# Patient Record
Sex: Male | Born: 1980
Health system: Southern US, Community
[De-identification: ages and names within clinical notes are randomized; demographics above are authoritative.]

## PROBLEM LIST (undated history)

## (undated) DIAGNOSIS — E785 Hyperlipidemia, unspecified: Secondary | ICD-10-CM

## (undated) DIAGNOSIS — F419 Anxiety disorder, unspecified: Secondary | ICD-10-CM

## (undated) DIAGNOSIS — Z8639 Personal history of other endocrine, nutritional and metabolic disease: Secondary | ICD-10-CM

## (undated) DIAGNOSIS — G4733 Obstructive sleep apnea (adult) (pediatric): Secondary | ICD-10-CM

## (undated) DIAGNOSIS — Z87891 Personal history of nicotine dependence: Secondary | ICD-10-CM

## (undated) DIAGNOSIS — Q2381 Bicuspid aortic valve: Secondary | ICD-10-CM

## (undated) DIAGNOSIS — E663 Overweight: Secondary | ICD-10-CM

## (undated) DIAGNOSIS — I77810 Thoracic aortic ectasia: Secondary | ICD-10-CM

## (undated) DIAGNOSIS — R7989 Other specified abnormal findings of blood chemistry: Secondary | ICD-10-CM

## (undated) HISTORY — DX: Thoracic aortic ectasia: I77.810

## (undated) HISTORY — DX: Personal history of nicotine dependence: Z87.891

## (undated) HISTORY — DX: Personal history of other endocrine, nutritional and metabolic disease: Z86.39

## (undated) HISTORY — DX: Other specified abnormal findings of blood chemistry: R79.89

## (undated) HISTORY — DX: Overweight: E66.3

## (undated) HISTORY — DX: Obstructive sleep apnea (adult) (pediatric): G47.33

## (undated) HISTORY — DX: Bicuspid aortic valve: Q23.81

## (undated) HISTORY — DX: Anxiety disorder, unspecified: F41.9

---

## 2014-03-02 ENCOUNTER — Other Ambulatory Visit (HOSPITAL_COMMUNITY): Payer: Self-pay | Admitting: Medical

## 2014-03-02 ENCOUNTER — Ambulatory Visit (HOSPITAL_COMMUNITY)
Admission: RE | Admit: 2014-03-02 | Discharge: 2014-03-02 | Disposition: A | Discharge status: Home / Self Care | Payer: 59 | Source: Ambulatory Visit | Attending: Medical | Admitting: Medical

## 2014-03-02 DIAGNOSIS — R1031 Right lower quadrant pain: Secondary | ICD-10-CM | POA: Insufficient documentation

## 2014-03-02 DIAGNOSIS — R911 Solitary pulmonary nodule: Secondary | ICD-10-CM | POA: Insufficient documentation

## 2014-03-02 DIAGNOSIS — D739 Disease of spleen, unspecified: Secondary | ICD-10-CM | POA: Insufficient documentation

## 2014-03-02 MED ORDER — IOHEXOL 300 MG/ML  SOLN
100.0000 mL | Freq: Once | INTRAMUSCULAR | Status: AC | PRN
  Administered 2014-03-02: 100 mL via INTRAVENOUS

## 2014-12-25 ENCOUNTER — Other Ambulatory Visit: Payer: Self-pay | Admitting: Medical

## 2014-12-25 ENCOUNTER — Other Ambulatory Visit: Payer: Self-pay | Admitting: Physician Assistant

## 2014-12-25 DIAGNOSIS — M25561 Pain in right knee: Secondary | ICD-10-CM

## 2014-12-31 ENCOUNTER — Ambulatory Visit
Admission: RE | Admit: 2014-12-31 | Discharge: 2014-12-31 | Disposition: A | Discharge status: Home / Self Care | Payer: 59 | Source: Ambulatory Visit | Attending: Medical | Admitting: Medical

## 2014-12-31 DIAGNOSIS — M25561 Pain in right knee: Secondary | ICD-10-CM

## 2015-05-28 ENCOUNTER — Other Ambulatory Visit: Payer: Self-pay | Admitting: Orthopedic Surgery

## 2015-06-05 ENCOUNTER — Encounter (HOSPITAL_BASED_OUTPATIENT_CLINIC_OR_DEPARTMENT_OTHER): Payer: Self-pay | Admitting: *Deleted

## 2015-06-07 NOTE — H&P (Signed)
Donald Swanson is an 34 y.o. male.    Chief Complaint: Right Knee Pain  HPI: Patient presents with a chief complaint of right knee pain.  Patient states that he injured his knee approximately 10 years ago when he was working close to the ground and kneeling.  When he stood up he notices a loud pop and soreness in his knee.  He did initially go to [facility] in 2006 and elected to treat his symptoms conservatively.  He was recently seen elsewhere where he had an MRI and is here today to discuss the results.  He states his pain comes and goes and is moderate in severity.  He describes a stabbing-type pain.  He is noticed swelling and weakness.  It does wake him from sleep.  Worse with activity and work and sitting crosslegged or working on the floor.  Better with rest.  He denies any fevers chills night sweats or other signs of infection.  Past Medical History  Diagnosis Date  . Hyperlipemia     Past Surgical History  Procedure Laterality Date  . No past surgeries      History reviewed. No pertinent family history. Social History:  reports that he has quit smoking. He has never used smokeless tobacco. He reports that he drinks alcohol. He reports that he does not use illicit drugs.  Allergies: No Known Allergies  No prescriptions prior to admission    No results found for this or any previous visit (from the past 48 hour(s)). No results found.  Review of Systems  Constitutional: Negative.   HENT:       Sinus problems  Eyes: Negative.   Respiratory: Negative.   Cardiovascular: Negative.   Gastrointestinal: Negative.   Genitourinary: Negative.   Musculoskeletal: Positive for joint pain.  Skin: Negative.   Neurological: Negative.   Endo/Heme/Allergies: Negative.   Psychiatric/Behavioral: Negative.     Height  (1.626 m), weight 77.111 kg (170 lb). Physical Exam  Constitutional: He is oriented to person, place, and time. He appears well-developed and  well-nourished.  HENT:  Head: Normocephalic and atraumatic.  Eyes: Pupils are equal, round, and reactive to light.  Neck: Normal range of motion. Neck supple.  Cardiovascular: Intact distal pulses.   Respiratory: Effort normal.  Musculoskeletal:  the patient's left knee has good strength good range of motion and no pain.  Patient's right knee also has good strength and good range of motion.  He does have tenderness over the medial joint line.  McMurray's test does cause a small increase in symptoms.  No instability.  His calves are soft and nontender.  He is neurovascularly intact distally.  Neurological: He is alert and oriented to person, place, and time.  Skin: Skin is warm and dry.  Psychiatric: He has a normal mood and affect. His behavior is normal. Judgment and thought content normal.     Assessment/Plan Assess: Right knee medial meniscal tear  Plan: Treatment options are discussed with the patient.  He is elected to proceed with surgery.  Benefits risks and potential competitions of surgery are discussed.  He wishes to proceed and a posting slip is completed.  Patient is to discuss timing with our surgery scheduler.  We anticipate seeing the patient back at time of surgical intervention.  No medications asked for or given today.  Call with any issues.  Donald Swanson 06/07/2015, 8:49 AM

## 2015-06-10 ENCOUNTER — Ambulatory Visit (HOSPITAL_BASED_OUTPATIENT_CLINIC_OR_DEPARTMENT_OTHER): Payer: 59 | Admitting: Anesthesiology

## 2015-06-10 ENCOUNTER — Encounter (HOSPITAL_BASED_OUTPATIENT_CLINIC_OR_DEPARTMENT_OTHER)
Admission: RE | Disposition: A | Discharge status: Home / Self Care | Payer: Self-pay | Source: Ambulatory Visit | Attending: Orthopedic Surgery

## 2015-06-10 ENCOUNTER — Encounter (HOSPITAL_BASED_OUTPATIENT_CLINIC_OR_DEPARTMENT_OTHER): Payer: Self-pay | Admitting: Anesthesiology

## 2015-06-10 ENCOUNTER — Ambulatory Visit (HOSPITAL_BASED_OUTPATIENT_CLINIC_OR_DEPARTMENT_OTHER)
Admission: RE | Admit: 2015-06-10 | Discharge: 2015-06-10 | Disposition: A | Discharge status: Home / Self Care | Payer: 59 | Source: Ambulatory Visit | Attending: Orthopedic Surgery | Admitting: Orthopedic Surgery

## 2015-06-10 DIAGNOSIS — M23321 Other meniscus derangements, posterior horn of medial meniscus, right knee: Secondary | ICD-10-CM | POA: Insufficient documentation

## 2015-06-10 DIAGNOSIS — Z87891 Personal history of nicotine dependence: Secondary | ICD-10-CM | POA: Diagnosis not present

## 2015-06-10 DIAGNOSIS — S83241A Other tear of medial meniscus, current injury, right knee, initial encounter: Secondary | ICD-10-CM

## 2015-06-10 DIAGNOSIS — E785 Hyperlipidemia, unspecified: Secondary | ICD-10-CM | POA: Insufficient documentation

## 2015-06-10 HISTORY — DX: Hyperlipidemia, unspecified: E78.5

## 2015-06-10 HISTORY — PX: KNEE ARTHROSCOPY WITH MEDIAL MENISECTOMY: SHX5651

## 2015-06-10 LAB — POCT HEMOGLOBIN-HEMACUE: Hemoglobin: 16.1 g/dL (ref 13.0–17.0)

## 2015-06-10 SURGERY — ARTHROSCOPY, KNEE, WITH MEDIAL MENISCECTOMY
Anesthesia: General | Laterality: Right

## 2015-06-10 MED ORDER — MIDAZOLAM HCL 2 MG/2ML IJ SOLN
INTRAMUSCULAR | Status: AC
  Filled 2015-06-10: qty 2

## 2015-06-10 MED ORDER — BUPIVACAINE-EPINEPHRINE 0.5% -1:200000 IJ SOLN
INTRAMUSCULAR | Status: DC | PRN
Start: 2015-06-10 — End: 2015-06-10
  Administered 2015-06-10: 30 mL

## 2015-06-10 MED ORDER — ONDANSETRON HCL 4 MG PO TABS: 4.0000 mg | ORAL_TABLET | Freq: Four times a day (QID) | ORAL | Status: DC | PRN

## 2015-06-10 MED ORDER — HYDROCODONE-ACETAMINOPHEN 5-325 MG PO TABS: 1.0000 | ORAL_TABLET | Freq: Four times a day (QID) | ORAL | Status: DC | PRN

## 2015-06-10 MED ORDER — OXYCODONE HCL 5 MG PO TABS
ORAL_TABLET | ORAL | Status: AC
  Filled 2015-06-10: qty 1

## 2015-06-10 MED ORDER — OXYCODONE HCL 5 MG PO TABS
5.0000 mg | ORAL_TABLET | Freq: Once | ORAL | Status: AC | PRN
  Administered 2015-06-10: 5 mg via ORAL

## 2015-06-10 MED ORDER — DEXTROSE 5 % IV SOLN
3.0000 g | INTRAVENOUS | Status: AC
  Administered 2015-06-10: 2 g via INTRAVENOUS

## 2015-06-10 MED ORDER — BUPIVACAINE HCL (PF) 0.5 % IJ SOLN
INTRAMUSCULAR | Status: AC
  Filled 2015-06-10: qty 30

## 2015-06-10 MED ORDER — ONDANSETRON HCL 4 MG/2ML IJ SOLN
INTRAMUSCULAR | Status: DC | PRN
  Administered 2015-06-10: 4 mg via INTRAVENOUS

## 2015-06-10 MED ORDER — PROPOFOL 10 MG/ML IV BOLUS
INTRAVENOUS | Status: DC | PRN
  Administered 2015-06-10: 200 mg via INTRAVENOUS

## 2015-06-10 MED ORDER — BUPIVACAINE-EPINEPHRINE (PF) 0.5% -1:200000 IJ SOLN
INTRAMUSCULAR | Status: AC
  Filled 2015-06-10: qty 30

## 2015-06-10 MED ORDER — FENTANYL CITRATE (PF) 100 MCG/2ML IJ SOLN
INTRAMUSCULAR | Status: AC
  Filled 2015-06-10: qty 4

## 2015-06-10 MED ORDER — DEXAMETHASONE SODIUM PHOSPHATE 4 MG/ML IJ SOLN
INTRAMUSCULAR | Status: DC | PRN
  Administered 2015-06-10: 10 mg via INTRAVENOUS

## 2015-06-10 MED ORDER — SCOPOLAMINE 1 MG/3DAYS TD PT72: 1.0000 | MEDICATED_PATCH | Freq: Once | TRANSDERMAL | Status: DC | PRN

## 2015-06-10 MED ORDER — LACTATED RINGERS IV SOLN
INTRAVENOUS | Status: DC
  Administered 2015-06-10: 13:00:00 via INTRAVENOUS

## 2015-06-10 MED ORDER — GLYCOPYRROLATE 0.2 MG/ML IJ SOLN: 0.2000 mg | Freq: Once | INTRAMUSCULAR | Status: DC | PRN

## 2015-06-10 MED ORDER — OXYCODONE HCL 5 MG/5ML PO SOLN: 5.0000 mg | Freq: Once | ORAL | Status: AC | PRN

## 2015-06-10 MED ORDER — CEFAZOLIN SODIUM-DEXTROSE 2-3 GM-% IV SOLR
INTRAVENOUS | Status: AC
  Filled 2015-06-10: qty 50

## 2015-06-10 MED ORDER — FENTANYL CITRATE (PF) 100 MCG/2ML IJ SOLN
50.0000 ug | INTRAMUSCULAR | Status: DC | PRN
  Administered 2015-06-10: 50 ug via INTRAVENOUS
  Administered 2015-06-10: 100 ug via INTRAVENOUS

## 2015-06-10 MED ORDER — LIDOCAINE HCL (CARDIAC) 20 MG/ML IV SOLN
INTRAVENOUS | Status: DC | PRN
  Administered 2015-06-10: 50 mg via INTRAVENOUS

## 2015-06-10 MED ORDER — METOCLOPRAMIDE HCL 5 MG PO TABS: 5.0000 mg | ORAL_TABLET | Freq: Three times a day (TID) | ORAL | Status: DC | PRN

## 2015-06-10 MED ORDER — ONDANSETRON HCL 4 MG/2ML IJ SOLN: 4.0000 mg | Freq: Four times a day (QID) | INTRAMUSCULAR | Status: DC | PRN

## 2015-06-10 MED ORDER — MEPERIDINE HCL 25 MG/ML IJ SOLN: 6.2500 mg | INTRAMUSCULAR | Status: DC | PRN

## 2015-06-10 MED ORDER — CHLORHEXIDINE GLUCONATE 4 % EX LIQD: 60.0000 mL | Freq: Once | CUTANEOUS | Status: DC

## 2015-06-10 MED ORDER — EPINEPHRINE HCL 1 MG/ML IJ SOLN
INTRAMUSCULAR | Status: DC | PRN
  Administered 2015-06-10: 1 mg

## 2015-06-10 MED ORDER — SODIUM CHLORIDE 0.9 % IR SOLN
Status: DC | PRN
  Administered 2015-06-10: 3000 mL

## 2015-06-10 MED ORDER — HYDROMORPHONE HCL 1 MG/ML IJ SOLN: 0.2500 mg | INTRAMUSCULAR | Status: DC | PRN

## 2015-06-10 MED ORDER — KETOROLAC TROMETHAMINE 30 MG/ML IJ SOLN
INTRAMUSCULAR | Status: DC | PRN
  Administered 2015-06-10: 30 mg via INTRAVENOUS

## 2015-06-10 MED ORDER — MIDAZOLAM HCL 2 MG/2ML IJ SOLN
1.0000 mg | INTRAMUSCULAR | Status: DC | PRN
  Administered 2015-06-10: 2 mg via INTRAVENOUS

## 2015-06-10 MED ORDER — METOCLOPRAMIDE HCL 5 MG/ML IJ SOLN: 5.0000 mg | Freq: Three times a day (TID) | INTRAMUSCULAR | Status: DC | PRN

## 2015-06-10 SURGICAL SUPPLY — 42 items
BANDAGE ELASTIC 6 VELCRO ST LF (GAUZE/BANDAGES/DRESSINGS) ×3 IMPLANT
BLADE 4.2CUDA (BLADE) IMPLANT
BLADE CUTTER GATOR 3.5 (BLADE) ×3 IMPLANT
BLADE GREAT WHITE 4.2 (BLADE) IMPLANT
BLADE GREAT WHITE 4.2MM (BLADE)
BNDG COHESIVE 6X5 TAN STRL LF (GAUZE/BANDAGES/DRESSINGS) ×3 IMPLANT
DRAPE ARTHROSCOPY W/POUCH 114 (DRAPES) ×3 IMPLANT
DURAPREP 26ML APPLICATOR (WOUND CARE) ×3 IMPLANT
ELECT MENISCUS 165MM 90D (ELECTRODE) IMPLANT
ELECT REM PT RETURN 9FT ADLT (ELECTROSURGICAL)
ELECTRODE REM PT RTRN 9FT ADLT (ELECTROSURGICAL) IMPLANT
GAUZE SPONGE 4X4 12PLY STRL (GAUZE/BANDAGES/DRESSINGS) ×3 IMPLANT
GAUZE XEROFORM 1X8 LF (GAUZE/BANDAGES/DRESSINGS) ×3 IMPLANT
GLOVE BIO SURGEON STRL SZ7.5 (GLOVE) ×3 IMPLANT
GLOVE BIO SURGEON STRL SZ8.5 (GLOVE) ×3 IMPLANT
GLOVE BIOGEL PI IND STRL 7.0 (GLOVE) ×2 IMPLANT
GLOVE BIOGEL PI IND STRL 8 (GLOVE) ×1 IMPLANT
GLOVE BIOGEL PI IND STRL 9 (GLOVE) ×1 IMPLANT
GLOVE BIOGEL PI INDICATOR 7.0 (GLOVE) ×4
GLOVE BIOGEL PI INDICATOR 8 (GLOVE) ×2
GLOVE BIOGEL PI INDICATOR 9 (GLOVE) ×2
GLOVE ECLIPSE 6.5 STRL STRAW (GLOVE) ×6 IMPLANT
GOWN STRL REUS W/ TWL LRG LVL3 (GOWN DISPOSABLE) ×2 IMPLANT
GOWN STRL REUS W/TWL LRG LVL3 (GOWN DISPOSABLE) ×4
GOWN STRL REUS W/TWL XL LVL3 (GOWN DISPOSABLE) ×3 IMPLANT
IV NS IRRIG 3000ML ARTHROMATIC (IV SOLUTION) ×3 IMPLANT
KNEE WRAP E Z 3 GEL PACK (MISCELLANEOUS) ×3 IMPLANT
MANIFOLD NEPTUNE II (INSTRUMENTS) IMPLANT
NDL SAFETY ECLIPSE 18X1.5 (NEEDLE) ×1 IMPLANT
NEEDLE HYPO 18GX1.5 SHARP (NEEDLE) ×2
PACK ARTHROSCOPY DSU (CUSTOM PROCEDURE TRAY) ×3 IMPLANT
PACK BASIN DAY SURGERY FS (CUSTOM PROCEDURE TRAY) ×3 IMPLANT
PAD ALCOHOL SWAB (MISCELLANEOUS) ×3 IMPLANT
PENCIL BUTTON HOLSTER BLD 10FT (ELECTRODE) IMPLANT
SET ARTHROSCOPY TUBING (MISCELLANEOUS) ×2
SET ARTHROSCOPY TUBING LN (MISCELLANEOUS) ×1 IMPLANT
SLEEVE SCD COMPRESS KNEE MED (MISCELLANEOUS) IMPLANT
SYR 3ML 18GX1 1/2 (SYRINGE) IMPLANT
SYR 5ML LL (SYRINGE) ×3 IMPLANT
TOWEL OR 17X24 6PK STRL BLUE (TOWEL DISPOSABLE) ×3 IMPLANT
WAND STAR VAC 90 (SURGICAL WAND) IMPLANT
WATER STERILE IRR 1000ML POUR (IV SOLUTION) ×3 IMPLANT

## 2015-06-10 NOTE — Interval H&P Note (Signed)
History and Physical Interval Note:  06/10/2015 2:08 PM  Donald Swanson  has presented today for surgery, with the diagnosis of RIGHTKNEE MEDIAL MENISCUS TEAR  The various methods of treatment have been discussed with the patient and family. After consideration of risks, benefits and other options for treatment, the patient has consented to  Procedure(s): RIGHT ARTHROSCOPY KNEE (Right) as a surgical intervention .  The patient's history has been reviewed, patient examined, no change in status, stable for surgery.  I have reviewed the patient's chart and labs.  Questions were answered to the patient's satisfaction.     [name]

## 2015-06-10 NOTE — Op Note (Signed)
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 Turner Daniels M.D.  Assist: Tomi Likens. Gaylene Brooks  (present throughout entire procedure and necessary for timely completion of the procedure) Anes: General LMA  EBL: Minimal  Fluids: 800 cc   Indications: catching popping and intermittent pain of the right knee that is gone for the last few years.  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 Pt has failed conservative treatment with anti-inflammatory medicines, physical therapy, and modified activites but did get good temporarily from an intra-articular cortisone injection. Pain has recurred and patient desires elective arthroscopic evaluation and treatment of knee. Risks and benefits of surgery have been discussed and questions answered.  Procedure: Patient identified by arm band and taken to the operating room at the day surgery [facility]. The appropriate anesthetic monitors were attached, and General LMA anesthesia was induced without difficulty. Lateral post was applied to the table and the lower extremity was prepped and draped in usual sterile fashion from the ankle to the midthigh. Time out procedure was performed. We began the operation by making standard inferior lateral and inferior medial peripatellar portals with a #11 blade allowing introduction of the arthroscope through the inferior lateral portal and the out flow to the inferior medial portal. Pump pressure was set at 100 mmHg and diagnostic arthroscopy  revealed a normal patellofemoral joint.  Moving into the medial compartment the medial meniscus had a large parrot-beak tear posterior horn that it's flipped into the notch region and was removed with a 3.5  Gator sucker shaver as well as a small arthroscopic biter.  There was some grade 2-3 chondromalacia of the medial femoral condyle near the notch over a 6 x 6 mm area and this was also debrided back to a stable margin with a 3.5 mm Gator sucker shaver.. The knee was irrigated out normal saline solution. A dressing of xerofoam 4 x 4 dressing sponges, web roll and an Ace wrap was applied. The patient was awakened extubated and taken to the recovery without difficulty.    Signed: [name], MD

## 2015-06-10 NOTE — Transfer of Care (Signed)
Immediate Anesthesia Transfer of Care Note  Patient: Donald Swanson  Procedure(s) Performed: Procedure(s): knee arthroscopy with medial menisectomy   (Right)  Patient Location: PACU  Anesthesia Type:General  Level of Consciousness: awake, alert  and oriented  Airway & Oxygen Therapy: Patient Spontanous Breathing and Patient connected to face mask oxygen  Post-op Assessment: Report given to RN and Post -op Vital signs reviewed and stable  Post vital signs: Reviewed and stable  Last Vitals:  Filed Vitals:   06/10/15 1330  BP: 134/62  Pulse: 58  Temp: 36.7 C  Resp: 16    Complications: No apparent anesthesia complications

## 2015-06-10 NOTE — Anesthesia Preprocedure Evaluation (Signed)
Anesthesia Evaluation  Patient identified by MRN, date of birth, ID band Patient awake    Reviewed: Allergy & Precautions, NPO status , Patient's Chart, lab work & pertinent test results  Airway Mallampati: I  TM Distance: >3 FB Neck ROM: Full    Dental  (+) Teeth Intact, Dental Advisory Given   Pulmonary former smoker,  breath sounds clear to auscultation        Cardiovascular Rhythm:Regular Rate:Normal     Neuro/Psych    GI/Hepatic   Endo/Other    Renal/GU      Musculoskeletal   Abdominal   Peds  Hematology   Anesthesia Other Findings   Reproductive/Obstetrics                             Anesthesia Physical Anesthesia Plan  ASA: I  Anesthesia Plan: General   Post-op Pain Management:    Induction: Intravenous  Airway Management Planned: LMA  Additional Equipment:   Intra-op Plan:   Post-operative Plan: Extubation in OR  Informed Consent: I have reviewed the patients History and Physical, chart, labs and discussed the procedure including the risks, benefits and alternatives for the proposed anesthesia with the patient or authorized representative who has indicated his/her understanding and acceptance.   Dental advisory given  Plan Discussed with: CRNA, Anesthesiologist and Surgeon  Anesthesia Plan Comments:        Anesthesia Quick Evaluation  

## 2015-06-10 NOTE — Discharge Instructions (Addendum)
Arthroscopic Procedure, Knee An arthroscopic procedure can find what is wrong with your knee. PROCEDURE Arthroscopy is a surgical technique that allows your orthopedic surgeon to diagnose and treat your knee injury with accuracy. They will look into your knee through a small instrument. This is almost like a small (pencil sized) telescope. Because arthroscopy affects your knee less than open knee surgery, you can anticipate a more rapid recovery. Taking an active role by following your caregiver's instructions will help with rapid and complete recovery. Use crutches, rest, elevation, ice, and knee exercises as instructed. The length of recovery depends on various factors including type of injury, age, physical condition, medical conditions, and your rehabilitation. Your knee is the joint between the large bones (femur and tibia) in your leg. Cartilage covers these bone ends which are smooth and slippery and allow your knee to bend and move smoothly. Two menisci, thick, semi-lunar shaped pads of cartilage which form a rim inside the joint, help absorb shock and stabilize your knee. Ligaments bind the bones together and support your knee joint. Muscles move the joint, help support your knee, and take stress off the joint itself. Because of this all programs and physical therapy to rehabilitate an injured or repaired knee require rebuilding and strengthening your muscles. AFTER THE PROCEDURE  After the procedure, you will be moved to a recovery area until most of the effects of the medication have worn off. Your caregiver will discuss the test results with you.  Only take over-the-counter or prescription medicines for pain, discomfort, or fever as directed by your caregiver. SEEK MEDICAL CARE IF:   You have increased bleeding from your wounds.  You see redness, swelling, or have increasing pain in your wounds.  You have pus coming from your wound.  You have an oral temperature above 102 F (38.9  C).  You notice a bad smell coming from the wound or dressing.  You have severe pain with any motion of your knee. SEEK IMMEDIATE MEDICAL CARE IF:   You develop a rash.  You have difficulty breathing.  You have any allergic problems. Document Released: 09/25/2000 Document Revised: 12/21/2011 Document Reviewed: 04/18/2008 [facility] [place] Patient Information 2015 [place], [place]. This information is not intended to replace advice given to you by your health care provider. Make sure you discuss any questions you have with your health care provider.  Post Anesthesia Home Care Instructions  Activity: Get plenty of rest for the remainder of the day. A responsible adult should stay with you for 24 hours following the procedure.  For the next 24 hours, DO NOT: -Drive a car -Paediatric nurse -Drink alcoholic beverages -Take any medication unless instructed by your physician -Make any legal decisions or sign important papers.  Meals: Start with liquid foods such as gelatin or soup. Progress to regular foods as tolerated. Avoid greasy, spicy, heavy foods. If nausea and/or vomiting occur, drink only clear liquids until the nausea and/or vomiting subsides. Call your physician if vomiting continues.  Special Instructions/Symptoms: Your throat may feel dry or sore from the anesthesia or the breathing tube placed in your throat during surgery. If this causes discomfort, gargle with warm salt water. The discomfort should disappear within 24 hours.  If you had a scopolamine patch placed behind your ear for the management of post- operative nausea and/or vomiting:  1. The medication in the patch is effective for 72 hours, after which it should be removed.  Wrap patch in a tissue and discard in the trash. Wash hands  thoroughly with soap and water. 2. You may remove the patch earlier than 72 hours if you experience unpleasant side effects which may include dry mouth, dizziness or visual disturbances. 3.  Avoid touching the patch. Wash your hands with soap and water after contact with the patch.    Post Anesthesia Home Care Instructions  Activity: Get plenty of rest for the remainder of the day. A responsible adult should stay with you for 24 hours following the procedure.  For the next 24 hours, DO NOT: -Drive a car -Advertising copywriter -Drink alcoholic beverages -Take any medication unless instructed by your physician -Make any legal decisions or sign important papers.  Meals: Start with liquid foods such as gelatin or soup. Progress to regular foods as tolerated. Avoid greasy, spicy, heavy foods. If nausea and/or vomiting occur, drink only clear liquids until the nausea and/or vomiting subsides. Call your physician if vomiting continues.  Special Instructions/Symptoms: Your throat may feel dry or sore from the anesthesia or the breathing tube placed in your throat during surgery. If this causes discomfort, gargle with warm salt water. The discomfort should disappear within 24 hours.  If you had a scopolamine patch placed behind your ear for the management of post- operative nausea and/or vomiting:  1. The medication in the patch is effective for 72 hours, after which it should be removed.  Wrap patch in a tissue and discard in the trash. Wash hands thoroughly with soap and water. 2. You may remove the patch earlier than 72 hours if you experience unpleasant side effects which may include dry mouth, dizziness or visual disturbances. 3. Avoid touching the patch. Wash your hands with soap and water after contact with the patch.    Post Anesthesia Home Care Instructions  Activity: Get plenty of rest for the remainder of the day. A responsible adult should stay with you for 24 hours following the procedure.  For the next 24 hours, DO NOT: -Drive a car -Advertising copywriter -Drink alcoholic beverages -Take any medication unless instructed by your physician -Make any legal decisions or sign  important papers.  Meals: Start with liquid foods such as gelatin or soup. Progress to regular foods as tolerated. Avoid greasy, spicy, heavy foods. If nausea and/or vomiting occur, drink only clear liquids until the nausea and/or vomiting subsides. Call your physician if vomiting continues.  Special Instructions/Symptoms: Your throat may feel dry or sore from the anesthesia or the breathing tube placed in your throat during surgery. If this causes discomfort, gargle with warm salt water. The discomfort should disappear within 24 hours.  If you had a scopolamine patch placed behind your ear for the management of post- operative nausea and/or vomiting:  1. The medication in the patch is effective for 72 hours, after which it should be removed.  Wrap patch in a tissue and discard in the trash. Wash hands thoroughly with soap and water. 2. You may remove the patch earlier than 72 hours if you experience unpleasant side effects which may include dry mouth, dizziness or visual disturbances. 3. Avoid touching the patch. Wash your hands with soap and water after contact with the patch.    Post Anesthesia Home Care Instructions  Activity: Get plenty of rest for the remainder of the day. A responsible adult should stay with you for 24 hours following the procedure.  For the next 24 hours, DO NOT: -Drive a car -Advertising copywriter -Drink alcoholic beverages -Take any medication unless instructed by your physician -Make any legal decisions  or sign important papers.  Meals: Start with liquid foods such as gelatin or soup. Progress to regular foods as tolerated. Avoid greasy, spicy, heavy foods. If nausea and/or vomiting occur, drink only clear liquids until the nausea and/or vomiting subsides. Call your physician if vomiting continues.  Special Instructions/Symptoms: Your throat may feel dry or sore from the anesthesia or the breathing tube placed in your throat during surgery. If this causes  discomfort, gargle with warm salt water. The discomfort should disappear within 24 hours.  If you had a scopolamine patch placed behind your ear for the management of post- operative nausea and/or vomiting:  1. The medication in the patch is effective for 72 hours, after which it should be removed.  Wrap patch in a tissue and discard in the trash. Wash hands thoroughly with soap and water. 2. You may remove the patch earlier than 72 hours if you experience unpleasant side effects which may include dry mouth, dizziness or visual disturbances. 3. Avoid touching the patch. Wash your hands with soap and water after contact with the patch.      Post Anesthesia Home Care Instructions  Activity: Get plenty of rest for the remainder of the day. A responsible adult should stay with you for 24 hours following the procedure.  For the next 24 hours, DO NOT: -Drive a car -Advertising copywriter -Drink alcoholic beverages -Take any medication unless instructed by your physician -Make any legal decisions or sign important papers.  Meals: Start with liquid foods such as gelatin or soup. Progress to regular foods as tolerated. Avoid greasy, spicy, heavy foods. If nausea and/or vomiting occur, drink only clear liquids until the nausea and/or vomiting subsides. Call your physician if vomiting continues.  Special Instructions/Symptoms: Your throat may feel dry or sore from the anesthesia or the breathing tube placed in your throat during surgery. If this causes discomfort, gargle with warm salt water. The discomfort should disappear within 24 hours.  If you had a scopolamine patch placed behind your ear for the management of post- operative nausea and/or vomiting:  1. The medication in the patch is effective for 72 hours, after which it should be removed.  Wrap patch in a tissue and discard in the trash. Wash hands thoroughly with soap and water. 2. You may remove the patch earlier than 72 hours if you  experience unpleasant side effects which may include dry mouth, dizziness or visual disturbances. 3. Avoid touching the patch. Wash your hands with soap and water after contact with the patch.

## 2015-06-11 NOTE — Anesthesia Postprocedure Evaluation (Signed)
  Anesthesia Post-op Note  Patient: Donald Swanson  Procedure(s) Performed: Procedure(s) (LRB): knee arthroscopy with medial menisectomy   (Right)  Patient Location: PACU  Anesthesia Type: General  Level of Consciousness: awake and alert   Airway and Oxygen Therapy: Patient Spontanous Breathing  Post-op Pain: mild  Post-op Assessment: Post-op Vital signs reviewed, Patient's Cardiovascular Status Stable, Respiratory Function Stable, Patent Airway and No signs of Nausea or vomiting  Last Vitals:  Filed Vitals:   06/10/15 1545  BP: 123/73  Pulse: 57  Temp: 36.6 C  Resp: 16    Post-op Vital Signs: stable   Complications: No apparent anesthesia complications

## 2015-06-11 NOTE — Addendum Note (Signed)
Addendum  created 06/11/15 1105 by Lance Coon, CRNA   Modules edited: Charges VN

## 2015-06-12 ENCOUNTER — Encounter (HOSPITAL_BASED_OUTPATIENT_CLINIC_OR_DEPARTMENT_OTHER): Payer: Self-pay | Admitting: Orthopedic Surgery

## 2015-06-18 ENCOUNTER — Encounter (HOSPITAL_BASED_OUTPATIENT_CLINIC_OR_DEPARTMENT_OTHER): Payer: Self-pay | Admitting: Orthopedic Surgery

## 2015-11-20 ENCOUNTER — Emergency Department (HOSPITAL_COMMUNITY)
Admission: EM | Admit: 2015-11-20 | Discharge: 2015-11-20 | Disposition: A | Discharge status: Home / Self Care | Payer: 59 | Source: Home / Self Care

## 2015-11-20 MED FILL — CEFDINIR 300 MG CAPSULE: 300 | 10 days supply | Qty: 20 | Fill #0

## 2015-12-17 ENCOUNTER — Encounter: Payer: Self-pay | Admitting: Family

## 2015-12-17 ENCOUNTER — Other Ambulatory Visit: Payer: Self-pay | Admitting: Family

## 2015-12-17 ENCOUNTER — Other Ambulatory Visit (INDEPENDENT_AMBULATORY_CARE_PROVIDER_SITE_OTHER): Payer: 59

## 2015-12-17 ENCOUNTER — Ambulatory Visit (INDEPENDENT_AMBULATORY_CARE_PROVIDER_SITE_OTHER): Payer: 59 | Admitting: Family

## 2015-12-17 VITALS — BP 122/80 | HR 50 | Temp 97.8°F | Resp 16 | Ht 62.0 in | Wt 178.0 lb

## 2015-12-17 DIAGNOSIS — R7989 Other specified abnormal findings of blood chemistry: Secondary | ICD-10-CM | POA: Insufficient documentation

## 2015-12-17 DIAGNOSIS — J019 Acute sinusitis, unspecified: Secondary | ICD-10-CM | POA: Diagnosis not present

## 2015-12-17 DIAGNOSIS — E785 Hyperlipidemia, unspecified: Secondary | ICD-10-CM | POA: Diagnosis not present

## 2015-12-17 DIAGNOSIS — E291 Testicular hypofunction: Secondary | ICD-10-CM | POA: Diagnosis not present

## 2015-12-17 LAB — CBC
HCT: 49.5 % (ref 39.0–52.0)
HEMOGLOBIN: 17.3 g/dL — AB (ref 13.0–17.0)
MCHC: 35 g/dL (ref 30.0–36.0)
MCV: 90.5 fl (ref 78.0–100.0)
PLATELETS: 212 10*3/uL (ref 150.0–400.0)
RBC: 5.47 Mil/uL (ref 4.22–5.81)
RDW: 13.8 % (ref 11.5–15.5)
WBC: 7.8 10*3/uL (ref 4.0–10.5)

## 2015-12-17 LAB — PSA: PSA: 0.81 ng/mL (ref 0.10–4.00)

## 2015-12-17 LAB — TESTOSTERONE: Testosterone: 176.39 ng/dL — ABNORMAL LOW (ref 300.00–890.00)

## 2015-12-17 MED ORDER — PREDNISONE 10 MG (21) PO TBPK: ORAL_TABLET | ORAL | Status: DC

## 2015-12-17 MED ORDER — ATORVASTATIN CALCIUM 10 MG PO TABS: 10.0000 mg | ORAL_TABLET | Freq: Every day | ORAL | Status: DC

## 2015-12-17 MED ORDER — TESTOSTERONE ENANTHATE 200 MG/ML IM SOLN: 200.0000 mg | INTRAMUSCULAR | Status: DC

## 2015-12-17 MED ORDER — AMOXICILLIN-POT CLAVULANATE 875-125 MG PO TABS: 1.0000 | ORAL_TABLET | Freq: Two times a day (BID) | ORAL | Status: DC

## 2015-12-17 MED FILL — ATORVASTATIN 10 MG TABLET: 10 | 90 days supply | Qty: 90 | Fill #0

## 2015-12-17 MED FILL — predniSONE 10 MG (21) TBPK: 10 | 6 days supply | Qty: 21 | Fill #0

## 2015-12-17 MED FILL — AMOX-CLAV 875-125 MG TABLET: 875-125 | 10 days supply | Qty: 20 | Fill #0

## 2015-12-17 NOTE — Progress Notes (Signed)
Subjective:    Patient ID: Donald Swanson, male    DOB: Feb 15, 1981, 35 y.o.   MRN: [id number]  Chief Complaint  Patient presents with  . Establish Care    testosterone refill    HPI:  Donald SealMatthew T Arocho is a 35 y.o. male who  has a past medical history of Hyperlipemia and Low testosterone. and presents today for an office visit to establish care.   1.) Low testosterone - Previously diagnosed with low testosterone and currently prescribed testosterone enanthate. Reports taking the medication as prescribed and denies adverse side effects. Notes that his symptoms are adequately controlled with the current dosage.   2.) Hyperlipidemia - Previously diagnosed with hyperlipidemia and currently prescribed atorvastatin. Reports taking the medication as prescribed and denies adverse side effects or myalgias.   3.) Sinus pressure - Associated symptom of sinus pressure, congestion, and cough have been going on for about 3 weeks. Denies fevers. Modifying factors include severe sinus decongestant which did not help very much. No recent antibiotics.      No Known Allergies   Outpatient Prescriptions Prior to Visit  Medication Sig Dispense Refill  . testosterone enanthate (DELATESTRYL) 200 MG/ML injection Inject 200 mg into the muscle every 28 (twenty-eight) days.    Marland Kitchen. HYDROcodone-acetaminophen (NORCO) 5-325 MG per tablet Take 1 tablet by mouth every 6 (six) hours as needed. 60 tablet 0   No facility-administered medications prior to visit.     Past Medical History  Diagnosis Date  . Hyperlipemia   . Low testosterone      Past Surgical History  Procedure Laterality Date  . No past surgeries    . Knee arthroscopy with medial menisectomy Right 06/10/2015    Procedure: knee arthroscopy with medial menisectomy  ;  Surgeon: Gean BirchwoodFrank Rowan, MD;  Location: [facility];  Service: Orthopedics;  Laterality: Right;     Family History  Problem Relation Age of Onset  . COPD  Mother      Social History   Social History  . Marital Status: Single    Spouse Name: N/A  . Number of Children: 1  . Years of Education: 14   Occupational History  . Mechanic    Social History Main Topics  . Smoking status: Former Smoker -- 0.25 packs/day for 9 years    Types: Cigarettes  . Smokeless tobacco: Never Used  . Alcohol Use: Yes     Comment: occas  . Drug Use: No  . Sexual Activity: Not on file   Other Topics Concern  . Not on file   Social History Narrative   Fun: Work on cars, guns, build      Review of Systems  Constitutional: Negative for fever, chills and fatigue.  HENT: Positive for congestion, sinus pressure and sore throat.   Respiratory: Positive for cough. Negative for chest tightness, shortness of breath and wheezing.   Cardiovascular: Negative for chest pain, palpitations and leg swelling.  Neurological: Negative for headaches.      Objective:    BP 122/80 mmHg  Pulse 50  Temp(Src) 97.8 F (36.6 C) (Oral)  Resp 16  Ht 5\' 2"  (1.575 m)  Wt 178 lb (80.74 kg)  BMI 32.55 kg/m2  SpO2 96% Nursing note and vital signs reviewed.  Physical Exam  Constitutional: He is oriented to person, place, and time. He appears well-developed and well-nourished. No distress.  HENT:  Right Ear: Hearing, tympanic membrane, external ear and ear canal normal.  Left Ear: Hearing, tympanic  membrane, external ear and ear canal normal.  Nose: Right sinus exhibits maxillary sinus tenderness and frontal sinus tenderness. Left sinus exhibits maxillary sinus tenderness and frontal sinus tenderness.  Mouth/Throat: Uvula is midline, oropharynx is clear and moist and mucous membranes are normal.  Neck: Neck supple.  Cardiovascular: Normal rate, regular rhythm, normal heart sounds and intact distal pulses.   Pulmonary/Chest: Effort normal and breath sounds normal.  Neurological: He is alert and oriented to person, place, and time.  Skin: Skin is warm and dry.    Psychiatric: He has a normal mood and affect. His behavior is normal. Judgment and thought content normal.       Assessment & Plan:   Problem List Items Addressed This Visit      Respiratory   Sinusitis, acute    Bacterial sinusitis and allergic rhinitis. Start Augmentin. Start prednisone taper. Continue over-the-counter medications as needed for symptom relief and supportive care. Follow-up if symptoms worsen or fail to improve.      Relevant Medications   amoxicillin-clavulanate (AUGMENTIN) 875-125 MG tablet   predniSONE (STERAPRED UNI-PAK 21 TAB) 10 MG (21) TBPK tablet     Other   Low testosterone - Primary    Low testosterone with on known current status. Reports taking the medication as prescribed with no adverse side effects. Obtain testosterone, CBC, and PSA. Continue current dosage of testosterone pending lab results.      Relevant Orders   Testosterone   CBC   PSA   Hyperlipidemia    Appears stable with current regimen and no adverse side effects. Due for a lipid profile although not fasting today. Encouraged to complete annual wellness exam and will obtain lipid profile at that time. Continue current dosage of atorvastatin.      Relevant Medications   atorvastatin (LIPITOR) 10 MG tablet

## 2015-12-17 NOTE — Assessment & Plan Note (Signed)
Appears stable with current regimen and no adverse side effects. Due for a lipid profile although not fasting today. Encouraged to complete annual wellness exam and will obtain lipid profile at that time. Continue current dosage of atorvastatin.

## 2015-12-17 NOTE — Assessment & Plan Note (Signed)
Bacterial sinusitis and allergic rhinitis. Start Augmentin. Start prednisone taper. Continue over-the-counter medications as needed for symptom relief and supportive care. Follow-up if symptoms worsen or fail to improve.

## 2015-12-17 NOTE — Progress Notes (Signed)
Pre visit review using our clinic review tool, if applicable. No additional management support is needed unless otherwise documented below in the visit note. 

## 2015-12-17 NOTE — Assessment & Plan Note (Signed)
Low testosterone with on known current status. Reports taking the medication as prescribed with no adverse side effects. Obtain testosterone, CBC, and PSA. Continue current dosage of testosterone pending lab results.

## 2015-12-17 NOTE — Patient Instructions (Addendum)
Thank you for choosing [facility].  Summary/Instructions:  Please continue to take your medications as prescribed.  Schedule a time for your physical at your convenience.  Your prescription(s) have been submitted to your pharmacy or been printed and provided for you. Please take as directed and contact our office if you believe you are having problem(s) with the medication(s) or have any questions.   Please stop by the lab on the basement level of the building for your blood work. Your results will be released to MyChart (or called to you) after review, usually within 72 hours after test completion. If any changes need to be made, you will be notified at that same time.  If your symptoms worsen or fail to improve, please contact our office for further instruction, or in case of emergency go directly to the emergency room at the closest medical facility.   General Recommendations:    Please drink plenty of fluids.  Get plenty of rest   Sleep in humidified air  Use saline nasal sprays  Netti pot   OTC Medications:  Decongestants - helps relieve congestion   Flonase (generic fluticasone) or Nasacort (generic triamcinolone) - please make sure to use the "cross-over" technique at a 45 degree angle towards the opposite eye as opposed to straight up the nasal passageway.   Sudafed (generic pseudoephedrine - Note this is the one that is available behind the pharmacy counter); Products with phenylephrine (-PE) may also be used but is often not as effective as pseudoephedrine.   If you have HIGH BLOOD PRESSURE - Coricidin HBP; AVOID any product that is -D as this contains pseudoephedrine which may increase your blood pressure.  Afrin (oxymetazoline) every 6-8 hours for up to 3 days.   Allergies - helps relieve runny nose, itchy eyes and sneezing   Claritin (generic loratidine), Allegra (fexofenidine), or Zyrtec (generic cyrterizine) for runny nose. These medications should  not cause drowsiness.  Note - Benadryl (generic diphenhydramine) may be used however may cause drowsiness  Cough -   Delsym or Robitussin (generic dextromethorphan)  Expectorants - helps loosen mucus to ease removal   Mucinex (generic guaifenesin) as directed on the package.  Headaches / General Aches   Tylenol (generic acetaminophen) - DO NOT EXCEED 3 grams (3,000 mg) in a 24 hour time period  Advil/Motrin (generic ibuprofen)   Sore Throat -   Salt water gargle   Chloraseptic (generic benzocaine) spray or lozenges / Sucrets (generic dyclonine)    Sinusitis Sinusitis is redness, soreness, and inflammation of the paranasal sinuses. Paranasal sinuses are air pockets within the bones of your face (beneath the eyes, the middle of the forehead, or above the eyes). In healthy paranasal sinuses, mucus is able to drain out, and air is able to circulate through them by way of your nose. However, when your paranasal sinuses are inflamed, mucus and air can become trapped. This can allow bacteria and other germs to grow and cause infection. Sinusitis can develop quickly and last only a short time (acute) or continue over a long period (chronic). Sinusitis that lasts for more than 12 weeks is considered chronic.  CAUSES  Causes of sinusitis include:  Allergies.  Structural abnormalities, such as displacement of the cartilage that separates your nostrils (deviated septum), which can decrease the air flow through your nose and sinuses and affect sinus drainage.  Functional abnormalities, such as when the small hairs (cilia) that line your sinuses and help remove mucus do not work properly or are  not present. SIGNS AND SYMPTOMS  Symptoms of acute and chronic sinusitis are the same. The primary symptoms are pain and pressure around the affected sinuses. Other symptoms include:  Upper toothache.  Earache.  Headache.  Bad breath.  Decreased sense of smell and taste.  A cough, which  worsens when you are lying flat.  Fatigue.  Fever.  Thick drainage from your nose, which often is green and may contain pus (purulent).  Swelling and warmth over the affected sinuses. DIAGNOSIS  Your health care provider will perform a physical exam. During the exam, your health care provider may:  Look in your nose for signs of abnormal growths in your nostrils (nasal polyps).  Tap over the affected sinus to check for signs of infection.  View the inside of your sinuses (endoscopy) using an imaging device that has a light attached (endoscope). If your health care provider suspects that you have chronic sinusitis, one or more of the following tests may be recommended:  Allergy tests.  Nasal culture. A sample of mucus is taken from your nose, sent to a lab, and screened for bacteria.  Nasal cytology. A sample of mucus is taken from your nose and examined by your health care provider to determine if your sinusitis is related to an allergy. TREATMENT  Most cases of acute sinusitis are related to a viral infection and will resolve on their own within 10 days. Sometimes medicines are prescribed to help relieve symptoms (pain medicine, decongestants, nasal steroid sprays, or saline sprays).  However, for sinusitis related to a bacterial infection, your health care provider will prescribe antibiotic medicines. These are medicines that will help kill the bacteria causing the infection.  Rarely, sinusitis is caused by a fungal infection. In theses cases, your health care provider will prescribe antifungal medicine. For some cases of chronic sinusitis, surgery is needed. Generally, these are cases in which sinusitis recurs more than 3 times per year, despite other treatments. HOME CARE INSTRUCTIONS   Drink plenty of water. Water helps thin the mucus so your sinuses can drain more easily.  Use a humidifier.  Inhale steam 3 to 4 times a day (for example, sit in the bathroom with the shower  running).  Apply a warm, moist washcloth to your face 3 to 4 times a day, or as directed by your health care provider.  Use saline nasal sprays to help moisten and clean your sinuses.  Take medicines only as directed by your health care provider.  If you were prescribed either an antibiotic or antifungal medicine, finish it all even if you start to feel better. SEEK IMMEDIATE MEDICAL CARE IF:  You have increasing pain or severe headaches.  You have nausea, vomiting, or drowsiness.  You have swelling around your face.  You have vision problems.  You have a stiff neck.  You have difficulty breathing. MAKE SURE YOU:   Understand these instructions.  Will watch your condition.  Will get help right away if you are not doing well or get worse. Document Released: 09/28/2005 Document Revised: 02/12/2014 Document Reviewed: 10/13/2011 [facility] Patient Information 2015 [employer], [place]. This information is not intended to replace advice given to you by your health care provider. Make sure you discuss any questions you have with your health care provider.

## 2015-12-25 ENCOUNTER — Telehealth: Payer: Self-pay | Admitting: *Deleted

## 2015-12-25 NOTE — Telephone Encounter (Signed)
Left msg on triage checking status on PA  For his  Testerone...Raechel Chute/lmb

## 2015-12-26 NOTE — Telephone Encounter (Signed)
Pt was wondering if you can call him or send him my chart massage once its done. He really need this done, its been 2 week.

## 2015-12-27 NOTE — Telephone Encounter (Signed)
PA initiated via CoverMyMeds, key [id number]

## 2015-12-30 MED FILL — TESTOSTERON ENAN 1,000 MG/5: 200 | 90 days supply | Qty: 5 | Fill #0

## 2015-12-30 NOTE — Telephone Encounter (Signed)
PA Approved 12/27/2015 - 12/26/2016

## 2016-01-30 ENCOUNTER — Ambulatory Visit: Payer: 59

## 2016-03-10 ENCOUNTER — Encounter: Payer: Self-pay | Admitting: Internal Medicine

## 2016-03-10 ENCOUNTER — Ambulatory Visit (INDEPENDENT_AMBULATORY_CARE_PROVIDER_SITE_OTHER): Payer: 59 | Admitting: Internal Medicine

## 2016-03-10 VITALS — BP 116/78 | HR 59 | Temp 98.2°F | Resp 14 | Ht 65.0 in | Wt 181.0 lb

## 2016-03-10 DIAGNOSIS — G5602 Carpal tunnel syndrome, left upper limb: Secondary | ICD-10-CM | POA: Diagnosis not present

## 2016-03-10 NOTE — Patient Instructions (Signed)
We recommend that you try the wrist splints from the drug store for carpal tunnel. You can ask the pharmacist if you need help finding them.   These help to rest the nerves so that the irritation can go down.   If you are still having problems or worsening problems after 2-3 weeks of using the brace call us back.   Carpal Tunnel Syndrome Carpal tunnel syndrome is a condition that causes pain in your hand and arm. The carpal tunnel is a narrow area located on the palm side of your wrist. Repeated wrist motion or certain diseases may cause swelling within the tunnel. This swelling pinches the main nerve in the wrist (median nerve). CAUSES  This condition may be caused by:   Repeated wrist motions.  Wrist injuries.  Arthritis.  A cyst or tumor in the carpal tunnel.  Fluid buildup during pregnancy. Sometimes the cause of this condition is not known.  RISK FACTORS This condition is more likely to develop in:   People who have jobs that cause them to repeatedly move their wrists in the same motion, such as butchers and cashiers.  Women.  People with certain conditions, such as:  Diabetes.  Obesity.  An underactive thyroid (hypothyroidism).  Kidney failure. SYMPTOMS  Symptoms of this condition include:   A tingling feeling in your fingers, especially in your thumb, index, and middle fingers.  Tingling or numbness in your hand.  An aching feeling in your entire arm, especially when your wrist and elbow are bent for long periods of time.  Wrist pain that goes up your arm to your shoulder.  Pain that goes down into your palm or fingers.  A weak feeling in your hands. You may have trouble grabbing and holding items. Your symptoms may feel worse during the night.  DIAGNOSIS  This condition is diagnosed with a medical history and physical exam. You may also have tests, including:   An electromyogram (EMG). This test measures electrical signals sent by your nerves into the  muscles.  X-rays. TREATMENT  Treatment for this condition includes:  Lifestyle changes. It is important to stop doing or modify the activity that caused your condition.  Physical or occupational therapy.  Medicines for pain and inflammation. This may include medicine that is injected into your wrist.  A wrist splint.  Surgery. HOME CARE INSTRUCTIONS  If You Have a Splint:  Wear it as told by your health care provider. Remove it only as told by your health care provider.  Loosen the splint if your fingers become numb and tingle, or if they turn cold and blue.  Keep the splint clean and dry. General Instructions  Take over-the-counter and prescription medicines only as told by your health care provider.  Rest your wrist from any activity that may be causing your pain. If your condition is work related, talk to your employer about changes that can be made, such as getting a wrist pad to use while typing.  If directed, apply ice to the painful area:  Put ice in a plastic bag.  Place a towel between your skin and the bag.  Leave the ice on for 20 minutes, 2-3 times per day.  Keep all follow-up visits as told by your health care provider. This is important.  Do any exercises as told by your health care provider, physical therapist, or occupational therapist. SEEK MEDICAL CARE IF:   You have new symptoms.  Your pain is not controlled with medicines.  Your symptoms  get worse.   This information is not intended to replace advice given to you by your health care provider. Make sure you discuss any questions you have with your health care provider.   Document Released: 09/25/2000 Document Revised: 06/19/2015 Document Reviewed: 02/13/2015 Elsevier Interactive Patient Education [employer].

## 2016-03-10 NOTE — Progress Notes (Signed)
Pre visit review using our clinic review tool, if applicable. No additional management support is needed unless otherwise documented below in the visit note. 

## 2016-03-10 NOTE — Assessment & Plan Note (Signed)
New problem and advised to try OTC braces for night time. Also advised he can use ibuprofen as needed.

## 2016-03-10 NOTE — Progress Notes (Signed)
   Subjective:    Patient ID: Donald Swanson, male    DOB: [birth year], 35 y.o.   MRN: [id number]  HPI The patient is a 35 YO man coming in for left middle finger pain. Started about 6 months ago with some night time numbness in his shoulders and upper arms and soreness. The soreness left and he tried several other sleeping positions and that is not as severe now. He is having for the last month or so the numbness and some shooting pains in his finger. No change to work or injury. No shoulder or neck injury in the past. He has tried ibuprofen the last couple of evenings with some good success.   Review of Systems  Constitutional: Negative.   Respiratory: Negative.   Cardiovascular: Negative.   Musculoskeletal: Positive for myalgias and arthralgias. Negative for back pain and gait problem.  Skin: Negative.   Neurological: Positive for numbness.      Objective:   Physical Exam  Constitutional: He appears well-developed and well-nourished.  HENT:  Head: Normocephalic and atraumatic.  Eyes: EOM are normal.  Neck: Normal range of motion.  Cardiovascular: Normal rate and regular rhythm.   Pulmonary/Chest: Effort normal and breath sounds normal.  Abdominal: Soft.  Musculoskeletal: He exhibits no tenderness.  Some mild change in sensation of left middle finger, no ROM changes, no soreness in the shoulder or neck or radiation of the pain past the wrist.  Neurological: No cranial nerve deficit. Coordination normal.   Filed Vitals:   03/10/16 1102  BP: 116/78  Pulse: 59  Temp: 98.2 F (36.8 C)  TempSrc: Oral  Resp: 14  Height: 5\' 5"  (1.651 m)  Weight: 181 lb (82.101 kg)  SpO2: 98%      Assessment & Plan:

## 2016-04-15 ENCOUNTER — Other Ambulatory Visit: Payer: Self-pay | Admitting: Family

## 2016-04-15 DIAGNOSIS — E785 Hyperlipidemia, unspecified: Secondary | ICD-10-CM

## 2016-04-15 MED ORDER — ATORVASTATIN CALCIUM 10 MG PO TABS: 10.0000 mg | ORAL_TABLET | Freq: Every day | ORAL | Status: DC

## 2016-04-15 MED FILL — ATORVASTATIN 10 MG TABLET: 10 | 90 days supply | Qty: 90 | Fill #0

## 2016-05-04 ENCOUNTER — Other Ambulatory Visit: Payer: Self-pay | Admitting: Family

## 2016-05-05 ENCOUNTER — Other Ambulatory Visit: Payer: Self-pay | Admitting: Family

## 2016-05-07 MED ORDER — TESTOSTERONE ENANTHATE 200 MG/ML IM SOLN: 200.0000 mg | INTRAMUSCULAR | 0 refills | Status: DC

## 2016-05-08 MED FILL — TESTOSTERON ENAN 1,000 MG/5: 200 | 90 days supply | Qty: 5 | Fill #0

## 2016-05-14 ENCOUNTER — Encounter: Payer: Self-pay | Admitting: Family

## 2016-05-14 ENCOUNTER — Ambulatory Visit (INDEPENDENT_AMBULATORY_CARE_PROVIDER_SITE_OTHER): Payer: 59 | Admitting: Family

## 2016-05-14 VITALS — BP 104/68 | HR 53 | Temp 98.2°F | Ht 64.0 in | Wt 176.5 lb

## 2016-05-14 DIAGNOSIS — Z0000 Encounter for general adult medical examination without abnormal findings: Secondary | ICD-10-CM

## 2016-05-14 DIAGNOSIS — Z5181 Encounter for therapeutic drug level monitoring: Secondary | ICD-10-CM

## 2016-05-14 DIAGNOSIS — Z0001 Encounter for general adult medical examination with abnormal findings: Secondary | ICD-10-CM | POA: Insufficient documentation

## 2016-05-14 NOTE — Assessment & Plan Note (Signed)
1) Anticipatory Guidance: Discussed importance of wearing a seatbelt while driving and not texting while driving; changing batteries in smoke detector at least once annually; wearing suntan lotion when outside; eating a balanced and moderate diet; getting physical activity at least 30 minutes per day.  2) Immunizations / Screenings / Labs:  All immunizations are up-to-date per recommendations. Due for a vision screen encouraged to be completed independently. Obtain testosterone and PSA for therapeutic drug monitoring for testosterone therapy. All other screenings are up-to-date per recommendations. Obtain CBC, CMET, and lipid profile.  Overall well exam with risk factors for cardiovascular disease including hyperlipidemia and obesity. Recommend weight loss of 5-10% of current body weight through nutrition and physical activity. Recommend increasing physical activity to 30 minutes of moderate level activity daily. Encourage nutritional intake that focuses on nutrient dense foods and is moderate, varied, and balanced and is low in saturated fats and processed/sugary foods. Continue healthy lifestyle behaviors and choices. Follow-up prevention exam in 1 year. Follow-up office visit or chronic conditions pending blood work.

## 2016-05-14 NOTE — Patient Instructions (Addendum)
Thank you for choosing [facility].  Summary/Instructions:  Your prescription(s) have been submitted to your pharmacy or been printed and provided for you. Please take as directed and contact our office if you believe you are having problem(s) with the medication(s) or have any questions.  Please stop by the lab on the lower level of the building for your blood work. Your results will be released to MyChart (or called to you) after review, usually within 72 hours after test completion. If any changes need to be made, you will be notified at that same time.  1. The lab is open from 7:30am to 5:30 pm Monday-Friday  2. No appointment is necessary  3. Fasting (if needed) is 6-8 hours after food and drink; black coffee and water  are okay    If your symptoms worsen or fail to improve, please contact our office for further instruction, or in case of emergency go directly to the emergency room at the closest medical facility.   Health Maintenance, Male A healthy lifestyle and preventative care can promote health and wellness.  Maintain regular health, dental, and eye exams.  Eat a healthy diet. Foods like vegetables, fruits, whole grains, low-fat dairy products, and lean protein foods contain the nutrients you need and are low in calories. Decrease your intake of foods high in solid fats, added sugars, and salt. Get information about a proper diet from your health care provider, if necessary.  Regular physical exercise is one of the most important things you can do for your health. Most adults should get at least 150 minutes of moderate-intensity exercise (any activity that increases your heart rate and causes you to sweat) each week. In addition, most adults need muscle-strengthening exercises on 2 or more days a week.   Maintain a healthy weight. The body mass index (BMI) is a screening tool to identify possible weight problems. It provides an estimate of body fat based on height and  weight. Your health care provider can find your BMI and can help you achieve or maintain a healthy weight. For males 20 years and older:  A BMI below 18.5 is considered underweight.  A BMI of 18.5 to 24.9 is normal.  A BMI of 25 to 29.9 is considered overweight.  A BMI of 30 and above is considered obese.  Maintain normal blood lipids and cholesterol by exercising and minimizing your intake of saturated fat. Eat a balanced diet with plenty of fruits and vegetables. Blood tests for lipids and cholesterol should begin at age 20 and be repeated every 5 years. If your lipid or cholesterol levels are high, you are over age 50, or you are at high risk for heart disease, you may need your cholesterol levels checked more frequently.Ongoing high lipid and cholesterol levels should be treated with medicines if diet and exercise are not working.  If you smoke, find out from your health care provider how to quit. If you do not use tobacco, do not start.  Lung cancer screening is recommended for adults aged 55-80 years who are at high risk for developing lung cancer because of a history of smoking. A yearly low-dose CT scan of the lungs is recommended for people who have at least a 30-pack-year history of smoking and are current smokers or have quit within the past 15 years. A pack year of smoking is smoking an average of 1 pack of cigarettes a day for 1 year (for example, a 30-pack-year history of smoking could mean smoking 1   pack a day for 30 years or 2 packs a day for 15 years). Yearly screening should continue until the smoker has stopped smoking for at least 15 years. Yearly screening should be stopped for people who develop a health problem that would prevent them from having lung cancer treatment.  If you choose to drink alcohol, do not have more than 2 drinks per day. One drink is considered to be 12 oz (360 mL) of beer, 5 oz (150 mL) of wine, or 1.5 oz (45 mL) of liquor.  Avoid the use of street  drugs. Do not share needles with anyone. Ask for help if you need support or instructions about stopping the use of drugs.  High blood pressure causes heart disease and increases the risk of stroke. High blood pressure is more likely to develop in:  People who have blood pressure in the end of the normal range (100-139/85-89 mm Hg).  People who are overweight or obese.  People who are African American.  If you are 18-39 years of age, have your blood pressure checked every 3-5 years. If you are 40 years of age or older, have your blood pressure checked every year. You should have your blood pressure measured twice--once when you are at a hospital or clinic, and once when you are not at a hospital or clinic. Record the average of the two measurements. To check your blood pressure when you are not at a hospital or clinic, you can use:  An automated blood pressure machine at a pharmacy.  A home blood pressure monitor.  If you are 45-79 years old, ask your health care provider if you should take aspirin to prevent heart disease.  Diabetes screening involves taking a blood sample to check your fasting blood sugar level. This should be done once every 3 years after age 45 if you are at a normal weight and without risk factors for diabetes. Testing should be considered at a younger age or be carried out more frequently if you are overweight and have at least 1 risk factor for diabetes.  Colorectal cancer can be detected and often prevented. Most routine colorectal cancer screening begins at the age of 50 and continues through age 75. However, your health care provider may recommend screening at an earlier age if you have risk factors for colon cancer. On a yearly basis, your health care provider may provide home test kits to check for hidden blood in the stool. A small camera at the end of a tube may be used to directly examine the colon (sigmoidoscopy or colonoscopy) to detect the earliest forms of  colorectal cancer. Talk to your health care provider about this at age 50 when routine screening begins. A direct exam of the colon should be repeated every 5-10 years through age 75, unless early forms of precancerous polyps or small growths are found.  People who are at an increased risk for hepatitis B should be screened for this virus. You are considered at high risk for hepatitis B if:  You were born in a country where hepatitis B occurs often. Talk with your health care provider about which countries are considered high risk.  Your parents were born in a high-risk country and you have not received a shot to protect against hepatitis B (hepatitis B vaccine).  You have HIV or AIDS.  You use needles to inject street drugs.  You live with, or have sex with, someone who has hepatitis B.  You are a man   who has sex with other men (MSM).  You get hemodialysis treatment.  You take certain medicines for conditions like cancer, organ transplantation, and autoimmune conditions.  Hepatitis C blood testing is recommended for all people born from 1945 through 1965 and any individual with known risk factors for hepatitis C.  Healthy men should no longer receive prostate-specific antigen (PSA) blood tests as part of routine cancer screening. Talk to your health care provider about prostate cancer screening.  Testicular cancer screening is not recommended for adolescents or adult males who have no symptoms. Screening includes self-exam, a health care provider exam, and other screening tests. Consult with your health care provider about any symptoms you have or any concerns you have about testicular cancer.  Practice safe sex. Use condoms and avoid high-risk sexual practices to reduce the spread of sexually transmitted infections (STIs).  You should be screened for STIs, including gonorrhea and chlamydia if:  You are sexually active and are younger than 24 years.  You are older than 24 years, and  your health care provider tells you that you are at risk for this type of infection.  Your sexual activity has changed since you were last screened, and you are at an increased risk for chlamydia or gonorrhea. Ask your health care provider if you are at risk.  If you are at risk of being infected with HIV, it is recommended that you take a prescription medicine daily to prevent HIV infection. This is called pre-exposure prophylaxis (PrEP). You are considered at risk if:  You are a man who has sex with other men (MSM).  You are a heterosexual man who is sexually active with multiple partners.  You take drugs by injection.  You are sexually active with a partner who has HIV.  Talk with your health care provider about whether you are at high risk of being infected with HIV. If you choose to begin PrEP, you should first be tested for HIV. You should then be tested every 3 months for as long as you are taking PrEP.  Use sunscreen. Apply sunscreen liberally and repeatedly throughout the day. You should seek shade when your shadow is shorter than you. Protect yourself by wearing long sleeves, pants, a wide-brimmed hat, and sunglasses year round whenever you are outdoors.  Tell your health care provider of new moles or changes in moles, especially if there is a change in shape or color. Also, tell your health care provider if a mole is larger than the size of a pencil eraser.  A one-time screening for abdominal aortic aneurysm (AAA) and surgical repair of large AAAs by ultrasound is recommended for men aged 65-75 years who are current or former smokers.  Stay current with your vaccines (immunizations).   This information is not intended to replace advice given to you by your health care provider. Make sure you discuss any questions you have with your health care provider.   Document Released: 03/26/2008 Document Revised: 10/19/2014 Document Reviewed: 02/23/2011 Elsevier Interactive Patient  Education 2016 [employer]vier Inc.   

## 2016-05-14 NOTE — Progress Notes (Signed)
Pre visit review using our clinic review tool, if applicable. No additional management support is needed unless otherwise documented below in the visit note. 

## 2016-05-14 NOTE — Progress Notes (Signed)
Subjective:    Patient ID: Donald Swanson, male    DOB: [birth year], 35 y.o.   MRN: [id number]  Chief Complaint  Patient presents with  . Annual Exam    HPI:  KEYSHAUN Swanson is a 35 y.o. male who presents today for an annual wellness visit.   1) Health Maintenance -   Diet - Averages about 3-4 meals per day consisting of a regular diet; Occasional fast/processed food; Caffeine intake of about 2 cups per day  Exercise - Work and work at home   2) Corporate investment banker / Immunizations:  Dental -- Up to date  Vision -- Due for exam   Health Maintenance  Topic Date Due  . HIV Screening  07/08/1998  . INFLUENZA VACCINE  05/12/2016  . TETANUS/TDAP  02/24/2023    Immunization History  Administered Date(s) Administered  . Tdap 02/23/2013    No Known Allergies   Outpatient Medications Prior to Visit  Medication Sig Dispense Refill  . atorvastatin (LIPITOR) 10 MG tablet Take 1 tablet (10 mg total) by mouth daily. 90 tablet 1  . testosterone enanthate (DELATESTRYL) 200 MG/ML injection Inject 1 mL (200 mg total) into the muscle every 28 (twenty-eight) days. 5 mL 0   No facility-administered medications prior to visit.      Past Medical History:  Diagnosis Date  . Hyperlipemia   . Low testosterone      Past Surgical History:  Procedure Laterality Date  . KNEE ARTHROSCOPY WITH MEDIAL MENISECTOMY Right 06/10/2015   Procedure: knee arthroscopy with medial menisectomy  ;  Surgeon: Gean Birchwood, MD;  Location: [facility];  Service: Orthopedics;  Laterality: Right;  . NO PAST SURGERIES       Family History  Problem Relation Age of Onset  . COPD Mother      Social History   Social History  . Marital status: Single    Spouse name: N/A  . Number of children: 1  . Years of education: 67   Occupational History  . Mechanic    Social History Main Topics  . Smoking status: Former Smoker    Packs/day: 0.25    Years: 9.00    Types:  Cigarettes  . Smokeless tobacco: Never Used  . Alcohol use Yes     Comment: occas  . Drug use: No  . Sexual activity: Not on file   Other Topics Concern  . Not on file   Social History Narrative   Fun: Work on cars, guns, build    Review of Systems  Constitutional: Denies fever, chills, fatigue, or significant weight gain/loss. HENT: Head: Denies headache or neck pain Ears: Denies changes in hearing, ringing in ears, earache, drainage Nose: Denies discharge, stuffiness, itching, nosebleed, sinus pain Throat: Denies sore throat, hoarseness, dry mouth, sores, thrush Eyes: Denies loss/changes in vision, pain, redness, blurry/double vision, flashing lights Cardiovascular: Denies chest pain/discomfort, tightness, palpitations, shortness of breath with activity, difficulty lying down, swelling, sudden awakening with shortness of breath Respiratory: Denies shortness of breath, cough, sputum production, wheezing Gastrointestinal: Denies dysphasia, heartburn, change in appetite, nausea, change in bowel habits, rectal bleeding, constipation, diarrhea, yellow skin or eyes Genitourinary: Denies frequency, urgency, burning/pain, blood in urine, incontinence, change in urinary strength. Musculoskeletal: Denies muscle/joint pain, stiffness, back pain, redness or swelling of joints, trauma Skin: Denies rashes, lumps, itching, dryness, color changes, or hair/nail changes Neurological: Denies dizziness, fainting, seizures, weakness, numbness, tingling, tremor Psychiatric - Denies nervousness, stress, depression or memory loss Endocrine: Denies  heat or cold intolerance, sweating, frequent urination, excessive thirst, changes in appetite Hematologic: Denies ease of bruising or bleeding     Objective:    BP 104/68 (BP Location: Left Arm, Patient Position: Sitting, Cuff Size: Normal)   Pulse (!) 53   Temp 98.2 F (36.8 C) (Oral)   Ht 5\' 4"  (1.626 m)   Wt 176 lb 8 oz (80.1 kg)   SpO2 95%   BMI  30.30 kg/m  Nursing note and vital signs reviewed.  Physical Exam  Constitutional: He is oriented to person, place, and time. He appears well-developed and well-nourished.  HENT:  Head: Normocephalic.  Right Ear: Hearing, tympanic membrane, external ear and ear canal normal.  Left Ear: Hearing, tympanic membrane, external ear and ear canal normal.  Nose: Nose normal.  Mouth/Throat: Uvula is midline, oropharynx is clear and moist and mucous membranes are normal.  Eyes: Conjunctivae and EOM are normal. Pupils are equal, round, and reactive to light.  Neck: Neck supple. No JVD present. No tracheal deviation present. No thyromegaly present.  Cardiovascular: Normal rate, regular rhythm, normal heart sounds and intact distal pulses.   Pulmonary/Chest: Effort normal and breath sounds normal.  Abdominal: Soft. Bowel sounds are normal. He exhibits no distension and no mass. There is no tenderness. There is no rebound and no guarding.  Musculoskeletal: Normal range of motion. He exhibits no edema or tenderness.  Lymphadenopathy:    He has no cervical adenopathy.  Neurological: He is alert and oriented to person, place, and time. He has normal reflexes. No cranial nerve deficit. He exhibits normal muscle tone. Coordination normal.  Skin: Skin is warm and dry.  Psychiatric: He has a normal mood and affect. His behavior is normal. Judgment and thought content normal.       Assessment & Plan:   Problem List Items Addressed This Visit      Other   Routine general medical examination at a health care facility - Primary    1) Anticipatory Guidance: Discussed importance of wearing a seatbelt while driving and not texting while driving; changing batteries in smoke detector at least once annually; wearing suntan lotion when outside; eating a balanced and moderate diet; getting physical activity at least 30 minutes per day.  2) Immunizations / Screenings / Labs:  All immunizations are up-to-date per  recommendations. Due for a vision screen encouraged to be completed independently. Obtain testosterone and PSA for therapeutic drug monitoring for testosterone therapy. All other screenings are up-to-date per recommendations. Obtain CBC, CMET, and lipid profile.  Overall well exam with risk factors for cardiovascular disease including hyperlipidemia and obesity. Recommend weight loss of 5-10% of current body weight through nutrition and physical activity. Recommend increasing physical activity to 30 minutes of moderate level activity daily. Encourage nutritional intake that focuses on nutrient dense foods and is moderate, varied, and balanced and is low in saturated fats and processed/sugary foods. Continue healthy lifestyle behaviors and choices. Follow-up prevention exam in 1 year. Follow-up office visit or chronic conditions pending blood work.      Relevant Orders   CBC   Comprehensive metabolic panel   Lipid panel   Testosterone   PSA    Other Visit Diagnoses    Therapeutic drug monitoring       Relevant Orders   CBC   Testosterone   PSA       I am having Mr. Geary maintain his atorvastatin and testosterone enanthate.   Follow-up: Return if symptoms worsen or fail to  improve.   Mauricio Po, FNP

## 2016-05-19 ENCOUNTER — Other Ambulatory Visit (INDEPENDENT_AMBULATORY_CARE_PROVIDER_SITE_OTHER): Payer: 59

## 2016-05-19 DIAGNOSIS — Z5181 Encounter for therapeutic drug level monitoring: Secondary | ICD-10-CM | POA: Diagnosis not present

## 2016-05-19 DIAGNOSIS — Z0000 Encounter for general adult medical examination without abnormal findings: Secondary | ICD-10-CM

## 2016-05-19 LAB — COMPREHENSIVE METABOLIC PANEL
ALBUMIN: 4.6 g/dL (ref 3.5–5.2)
ALT: 47 U/L (ref 0–53)
AST: 26 U/L (ref 0–37)
Alkaline Phosphatase: 53 U/L (ref 39–117)
BUN: 11 mg/dL (ref 6–23)
CHLORIDE: 104 meq/L (ref 96–112)
CO2: 29 mEq/L (ref 19–32)
Calcium: 9.9 mg/dL (ref 8.4–10.5)
Creatinine, Ser: 1.13 mg/dL (ref 0.40–1.50)
GFR: 79.5 mL/min (ref >60.00)
GLUCOSE: 94 mg/dL (ref 70–99)
POTASSIUM: 4 meq/L (ref 3.5–5.1)
SODIUM: 141 meq/L (ref 135–145)
Total Bilirubin: 1.2 mg/dL (ref 0.2–1.2)
Total Protein: 7.4 g/dL (ref 6.0–8.3)

## 2016-05-19 LAB — CBC
HEMATOCRIT: 48.9 % (ref 39.0–52.0)
Hemoglobin: 16.9 g/dL (ref 13.0–17.0)
MCHC: 34.6 g/dL (ref 30.0–36.0)
MCV: 93.1 fl (ref 78.0–100.0)
Platelets: 201 10*3/uL (ref 150.0–400.0)
RBC: 5.25 Mil/uL (ref 4.22–5.81)
RDW: 14 % (ref 11.5–15.5)
WBC: 5.9 10*3/uL (ref 4.0–10.5)

## 2016-05-19 LAB — LIPID PANEL
CHOLESTEROL: 163 mg/dL (ref 0–200)
HDL: 33 mg/dL — AB (ref >39.00)
LDL CALC: 99 mg/dL (ref 0–99)
NonHDL: 129.91
Total CHOL/HDL Ratio: 5
Triglycerides: 153 mg/dL — ABNORMAL HIGH (ref 0.0–149.0)
VLDL: 30.6 mg/dL (ref 0.0–40.0)

## 2016-05-19 LAB — TESTOSTERONE: TESTOSTERONE: 160.75 ng/dL — AB (ref 300.00–890.00)

## 2016-05-19 LAB — PSA: PSA: 0.68 ng/mL (ref 0.10–4.00)

## 2016-05-20 ENCOUNTER — Encounter: Payer: Self-pay | Admitting: Family

## 2016-06-04 ENCOUNTER — Ambulatory Visit (INDEPENDENT_AMBULATORY_CARE_PROVIDER_SITE_OTHER): Payer: 59 | Admitting: General Practice

## 2016-06-04 DIAGNOSIS — E291 Testicular hypofunction: Secondary | ICD-10-CM

## 2016-06-04 DIAGNOSIS — R7989 Other specified abnormal findings of blood chemistry: Secondary | ICD-10-CM

## 2016-06-04 MED ORDER — TESTOSTERONE ENANTHATE 200 MG/ML IM SOLN
200.0000 mg | INTRAMUSCULAR | Status: DC
  Administered 2016-06-04: 200 mg via INTRAMUSCULAR

## 2016-06-04 NOTE — Progress Notes (Signed)
Medical screening examination/treatment/procedure(s) were performed by non-physician practitioner and as supervising provider I was immediately available for consultation/collaboration. I agree with treatment plan. Calone, Gregory, FNP   

## 2016-06-18 ENCOUNTER — Ambulatory Visit (INDEPENDENT_AMBULATORY_CARE_PROVIDER_SITE_OTHER): Payer: 59 | Admitting: General Practice

## 2016-06-18 DIAGNOSIS — E291 Testicular hypofunction: Secondary | ICD-10-CM | POA: Diagnosis not present

## 2016-06-18 MED ORDER — TESTOSTERONE ENANTHATE 200 MG/ML IM SOLN
200.0000 mg | INTRAMUSCULAR | Status: DC
  Administered 2016-06-18: 200 mg via INTRAMUSCULAR

## 2016-06-18 NOTE — Progress Notes (Signed)
I have reviewed and agree with the treatment plan.  

## 2016-07-02 ENCOUNTER — Ambulatory Visit (INDEPENDENT_AMBULATORY_CARE_PROVIDER_SITE_OTHER): Payer: 59 | Admitting: General Practice

## 2016-07-02 DIAGNOSIS — Z23 Encounter for immunization: Secondary | ICD-10-CM | POA: Diagnosis not present

## 2016-07-02 DIAGNOSIS — E291 Testicular hypofunction: Secondary | ICD-10-CM | POA: Diagnosis not present

## 2016-07-02 MED ORDER — TESTOSTERONE CYPIONATE 200 MG/ML IM SOLN
200.0000 mg | INTRAMUSCULAR | Status: DC
  Administered 2016-07-02: 200 mg via INTRAMUSCULAR

## 2016-07-07 ENCOUNTER — Other Ambulatory Visit: Payer: Self-pay | Admitting: Family

## 2016-07-08 MED FILL — TESTOSTERON ENAN 1,000 MG/5: 200 | 70 days supply | Qty: 5 | Fill #0

## 2016-07-08 NOTE — Telephone Encounter (Signed)
Rx faxed

## 2016-07-16 DIAGNOSIS — H5212 Myopia, left eye: Secondary | ICD-10-CM | POA: Diagnosis not present

## 2016-08-04 ENCOUNTER — Encounter: Payer: Self-pay | Admitting: Family

## 2016-08-04 DIAGNOSIS — E782 Mixed hyperlipidemia: Secondary | ICD-10-CM

## 2016-08-05 MED ORDER — ATORVASTATIN CALCIUM 10 MG PO TABS: 10.0000 mg | ORAL_TABLET | Freq: Every day | ORAL | 1 refills | Status: DC

## 2016-08-05 MED FILL — ATORVASTATIN 10 MG TABLET: 10 | 90 days supply | Qty: 90 | Fill #0

## 2016-08-31 ENCOUNTER — Telehealth: Payer: 59 | Admitting: Family

## 2016-08-31 DIAGNOSIS — J019 Acute sinusitis, unspecified: Secondary | ICD-10-CM | POA: Diagnosis not present

## 2016-08-31 MED ORDER — AMOXICILLIN-POT CLAVULANATE 875-125 MG PO TABS: 1.0000 | ORAL_TABLET | Freq: Two times a day (BID) | ORAL | 0 refills | Status: DC

## 2016-08-31 MED FILL — AMOX TR-K CLV 875-125 MG TA: 875-125 | 7 days supply | Qty: 14 | Fill #0

## 2016-08-31 NOTE — Progress Notes (Signed)

## 2016-09-11 ENCOUNTER — Other Ambulatory Visit: Payer: Self-pay | Admitting: Family

## 2016-09-13 MED ORDER — TESTOSTERONE ENANTHATE 200 MG/ML IM SOLN: 200.0000 mg | INTRAMUSCULAR | 0 refills | Status: DC

## 2016-09-14 MED FILL — TESTOSTERON ENAN 1,000 MG/5: 200 | 70 days supply | Qty: 5 | Fill #0

## 2016-10-25 ENCOUNTER — Other Ambulatory Visit: Payer: Self-pay | Admitting: Family

## 2016-10-26 ENCOUNTER — Other Ambulatory Visit: Payer: Self-pay | Admitting: Family

## 2016-11-02 MED ORDER — TESTOSTERONE ENANTHATE 200 MG/ML IM SOLN: 200.0000 mg | INTRAMUSCULAR | 0 refills | Status: DC

## 2016-11-05 ENCOUNTER — Telehealth: Payer: 59 | Admitting: Family

## 2016-11-05 DIAGNOSIS — J329 Chronic sinusitis, unspecified: Secondary | ICD-10-CM | POA: Diagnosis not present

## 2016-11-05 DIAGNOSIS — B9689 Other specified bacterial agents as the cause of diseases classified elsewhere: Secondary | ICD-10-CM | POA: Diagnosis not present

## 2016-11-05 MED ORDER — AMOXICILLIN-POT CLAVULANATE 875-125 MG PO TABS: 1.0000 | ORAL_TABLET | Freq: Two times a day (BID) | ORAL | 0 refills | Status: DC

## 2016-11-05 MED ORDER — DOXYCYCLINE HYCLATE 100 MG PO TABS: 100.0000 mg | ORAL_TABLET | Freq: Two times a day (BID) | ORAL | 0 refills | Status: DC

## 2016-11-05 MED FILL — DOXYCYCLINE HYCLATE 100 MG: 100 | 10 days supply | Qty: 20 | Fill #0

## 2016-11-05 NOTE — Progress Notes (Signed)

## 2016-11-05 NOTE — Progress Notes (Signed)

## 2016-11-05 NOTE — Addendum Note (Signed)
Addended by: Beau FannyWITHROW, Nilsa Macht C on: 11/05/2016 11:33 AM   Modules accepted: Orders

## 2016-11-17 ENCOUNTER — Encounter: Payer: Self-pay | Admitting: Family

## 2016-11-17 DIAGNOSIS — E782 Mixed hyperlipidemia: Secondary | ICD-10-CM

## 2016-11-17 MED ORDER — ATORVASTATIN CALCIUM 10 MG PO TABS: 10.0000 mg | ORAL_TABLET | Freq: Every day | ORAL | 1 refills | Status: DC

## 2016-11-18 MED FILL — ATORVASTATIN 10 MG TABLET: 10 | 90 days supply | Qty: 90 | Fill #0

## 2016-11-18 MED FILL — TESTOSTERON ENAN 1,000 MG/5: 200 | 70 days supply | Qty: 5 | Fill #0

## 2016-11-23 ENCOUNTER — Encounter: Payer: Self-pay | Admitting: Family

## 2016-11-23 MED ORDER — LEVOFLOXACIN 500 MG PO TABS: 500.0000 mg | ORAL_TABLET | Freq: Every day | ORAL | 0 refills | Status: DC

## 2016-11-23 MED FILL — levoFLOXacin 500 MG TABS: 500 | 7 days supply | Qty: 7 | Fill #0

## 2016-12-07 ENCOUNTER — Telehealth: Payer: Self-pay

## 2016-12-07 NOTE — Telephone Encounter (Signed)
PA initiated and approved for testosterone enanthate 200 mg

## 2017-01-19 DIAGNOSIS — M25511 Pain in right shoulder: Secondary | ICD-10-CM | POA: Diagnosis not present

## 2017-01-19 DIAGNOSIS — G5602 Carpal tunnel syndrome, left upper limb: Secondary | ICD-10-CM | POA: Diagnosis not present

## 2017-01-19 DIAGNOSIS — M25512 Pain in left shoulder: Secondary | ICD-10-CM | POA: Diagnosis not present

## 2017-01-19 DIAGNOSIS — G5601 Carpal tunnel syndrome, right upper limb: Secondary | ICD-10-CM | POA: Diagnosis not present

## 2017-01-19 MED FILL — NABUMETONE 750 MG TABLET: 750 | 30 days supply | Qty: 60 | Fill #0

## 2017-01-25 DIAGNOSIS — R200 Anesthesia of skin: Secondary | ICD-10-CM | POA: Diagnosis not present

## 2017-02-01 ENCOUNTER — Encounter: Payer: Self-pay | Admitting: Family

## 2017-02-01 ENCOUNTER — Other Ambulatory Visit: Payer: Self-pay | Admitting: Family

## 2017-02-02 ENCOUNTER — Other Ambulatory Visit: Payer: Self-pay | Admitting: Family

## 2017-02-02 DIAGNOSIS — R7989 Other specified abnormal findings of blood chemistry: Secondary | ICD-10-CM

## 2017-02-03 MED ORDER — DICYCLOMINE HCL 10 MG PO CAPS: 10.0000 mg | ORAL_CAPSULE | Freq: Three times a day (TID) | ORAL | 0 refills | Status: DC | PRN

## 2017-02-03 MED FILL — DICYCLOMINE 10 MG CAPSULE: 10 | 20 days supply | Qty: 60 | Fill #0

## 2017-02-04 ENCOUNTER — Other Ambulatory Visit (INDEPENDENT_AMBULATORY_CARE_PROVIDER_SITE_OTHER): Payer: 59

## 2017-02-04 ENCOUNTER — Telehealth: Payer: Self-pay

## 2017-02-04 DIAGNOSIS — R7989 Other specified abnormal findings of blood chemistry: Secondary | ICD-10-CM

## 2017-02-04 DIAGNOSIS — D582 Other hemoglobinopathies: Secondary | ICD-10-CM

## 2017-02-04 LAB — CBC
HEMATOCRIT: 52.4 % — AB (ref 39.0–52.0)
MCHC: 34.7 g/dL (ref 30.0–36.0)
MCV: 92.2 fl (ref 78.0–100.0)
Platelets: 183 10*3/uL (ref 150.0–400.0)
RBC: 5.69 Mil/uL (ref 4.22–5.81)
RDW: 13.3 % (ref 11.5–15.5)
WBC: 5.6 10*3/uL (ref 4.0–10.5)

## 2017-02-04 LAB — TESTOSTERONE: TESTOSTERONE: 115.33 ng/dL — AB (ref 300.00–890.00)

## 2017-02-04 NOTE — Addendum Note (Signed)
Addended by: Mercer Pod E on: 02/04/2017 11:49 AM   Modules accepted: Orders

## 2017-02-04 NOTE — Telephone Encounter (Signed)
Pt aware.

## 2017-02-04 NOTE — Telephone Encounter (Signed)
Please stop taking testosterone and would recommend donating blood if he has not recently. We will need to recheck after blood donation for re-consideration of starting testosterone again.

## 2017-02-04 NOTE — Telephone Encounter (Signed)
Routing to greg, also advised greg in office---patient has critical lab value---18.2 Hemoglobin---please advise, I will call patient back, thanks

## 2017-03-02 ENCOUNTER — Other Ambulatory Visit (INDEPENDENT_AMBULATORY_CARE_PROVIDER_SITE_OTHER): Payer: 59

## 2017-03-02 ENCOUNTER — Encounter: Payer: Self-pay | Admitting: Family

## 2017-03-02 DIAGNOSIS — D582 Other hemoglobinopathies: Secondary | ICD-10-CM | POA: Diagnosis not present

## 2017-03-02 LAB — CBC
HCT: 46.1 % (ref 39.0–52.0)
Hemoglobin: 16.3 g/dL (ref 13.0–17.0)
MCHC: 35.4 g/dL (ref 30.0–36.0)
MCV: 89.5 fl (ref 78.0–100.0)
PLATELETS: 192 10*3/uL (ref 150.0–400.0)
RBC: 5.15 Mil/uL (ref 4.22–5.81)
RDW: 13.1 % (ref 11.5–15.5)
WBC: 5.7 10*3/uL (ref 4.0–10.5)

## 2017-03-02 MED ORDER — TESTOSTERONE ENANTHATE 200 MG/ML IM SOLN
200.0000 mg | INTRAMUSCULAR | 0 refills | Status: DC
Start: 2017-03-02 — End: 2017-03-11

## 2017-03-11 ENCOUNTER — Encounter: Payer: Self-pay | Admitting: Family

## 2017-03-11 ENCOUNTER — Ambulatory Visit (INDEPENDENT_AMBULATORY_CARE_PROVIDER_SITE_OTHER): Payer: 59 | Admitting: Family

## 2017-03-11 VITALS — BP 124/82 | HR 57 | Temp 98.2°F | Resp 16 | Ht 64.0 in | Wt 175.0 lb

## 2017-03-11 DIAGNOSIS — R7989 Other specified abnormal findings of blood chemistry: Secondary | ICD-10-CM | POA: Diagnosis not present

## 2017-03-11 NOTE — Progress Notes (Signed)
Subjective:    Patient ID: Donald Swanson, male    DOB: Oct 31, 1980, 36 y.o.   MRN: [id number]  Chief Complaint  Patient presents with  . Medication Management    wants to talk about testosterone, was having problems with shoulders and arms and hand going numb and noticed the numbness got better when he stopped testosterone    HPI:  Donald Swanson is a 36 y.o. male who  has a past medical history of Hyperlipemia and Low testosterone. and presents today for a follow up office visit.  Medication management - New onset numbness and tingling in his hands and his shoulder was aching and noted that when he stopped taking the testosterone he noted that his symptoms improved. Has been off of the testosterone for about one and half months. Has noted increased levels of fatigue since stopping the medication.   No Known Allergies    Outpatient Medications Prior to Visit  Medication Sig Dispense Refill  . atorvastatin (LIPITOR) 10 MG tablet Take 1 tablet (10 mg total) by mouth daily. 90 tablet 1  . dicyclomine (BENTYL) 10 MG capsule Take 1 capsule (10 mg total) by mouth 3 (three) times daily as needed for spasms. 60 capsule 0  . levofloxacin (LEVAQUIN) 500 MG tablet Take 1 tablet (500 mg total) by mouth daily. 7 tablet 0  . testosterone enanthate (DELATESTRYL) 200 MG/ML injection Inject 1 mL (200 mg total) into the muscle every 14 (fourteen) days. (Patient not taking: Reported on 03/11/2017) 5 mL 0   No facility-administered medications prior to visit.      Review of Systems  Constitutional: Negative for chills and fever.  Respiratory: Negative for chest tightness and shortness of breath.   Cardiovascular: Negative for chest pain, palpitations and leg swelling.      Objective:    BP 124/82 (BP Location: Left Arm, Patient Position: Sitting, Cuff Size: Large)   Pulse (!) 57   Temp 98.2 F (36.8 C) (Oral)   Resp 16   Ht 5\' 4"  (1.626 m)   Wt 175 lb (79.4 kg)   SpO2 98%    BMI 30.04 kg/m  Nursing note and vital signs reviewed.  Physical Exam  Constitutional: He is oriented to person, place, and time. He appears well-developed and well-nourished. No distress.  Cardiovascular: Normal rate, regular rhythm, normal heart sounds and intact distal pulses.  Exam reveals no gallop and no friction rub.   No murmur heard. Pulmonary/Chest: Effort normal and breath sounds normal. No respiratory distress. He has no wheezes. He has no rales. He exhibits no tenderness.  Neurological: He is alert and oriented to person, place, and time.  Skin: Skin is warm and dry.  Psychiatric: He has a normal mood and affect. His behavior is normal. Judgment and thought content normal.       Assessment & Plan:   Problem List Items Addressed This Visit      Other   Low testosterone - Primary    New onset side effects improved with stopping testosterone. Recommend holding medication with referral to urology being sent for further assessment and treatment. Continue to monitor CBC pending urology referral.       Relevant Orders   Ambulatory referral to Urology       I have discontinued Mr. Amaral's levofloxacin and testosterone enanthate. I am also having him maintain his atorvastatin and dicyclomine.   Follow-up: Return in about 3 months (around 06/11/2017), or if symptoms worsen or fail to improve.  Mauricio Po, FNP

## 2017-03-11 NOTE — Assessment & Plan Note (Signed)
New onset side effects improved with stopping testosterone. Recommend holding medication with referral to urology being sent for further assessment and treatment. Continue to monitor CBC pending urology referral.

## 2017-03-11 NOTE — Patient Instructions (Signed)
Thank you for choosing [facility].  SUMMARY AND INSTRUCTIONS:  Please stay off the testosterone.  We will send a referral to urology.   Follow up:  If your symptoms worsen or fail to improve, please contact our office for further instruction, or in case of emergency go directly to the emergency room at the closest medical facility.    Testosterone Replacement Therapy Testosterone replacement therapy (TRT) is used to treat men who have a low testosterone level (hypogonadism). Testosterone is a male hormone that is produced in the testicles. It is responsible for typically male characteristics and for maintaining a man's sex drive and the ability to get an erection. Testosterone also supports bone and muscle health. TRT can be a gel, liquid, or patch that you put on your skin. It can also be in the form of a tablet or an injection. In some cases, your health care provider may insert long-acting pellets under your skin. In most men, the level of testosterone starts to decline gradually after age 75. Low testosterone can also be caused by certain medical conditions, medicines, and obesity. Your health care provider can diagnose hypogonadism with at least two blood tests that are done early in the morning. Low testosterone may not need to be treated. TRT is usually a choice that you make with your health care provider. Your health care provider may recommend TRT if you have low testosterone that is causing symptoms, such as:  Low sex drive.  Erection problems.  Breast enlargement.  Loss of body hair.  Weak muscles or bones.  Shrinking testicles.  Increased body fat.  Low energy.  Hot flashes.  Depression.  Decreased work International aid/development worker.  TRT is a lifetime treatment. If you stop treatment, your testosterone will drop, and your symptoms may return. What are the risks? Testosterone replacement therapy may have side effects, including:  Lower sperm count.  Skin irritation at  the application or injection site.  Mouth irritation if you take an oral tablet.  Acne.  Swelling of your legs or feet.  Tender breasts.  Dizziness.  Sleep disturbance.  Mood swings.  Possible increased risk of stroke or heart attack.  Testosterone replacement therapy may also increase your risk for prostate cancer or male breast cancer. You should not use TRT if you have either of those conditions. Your health care provider also may not recommend TRT if:  You are suspected of having prostate cancer.  You want to father a child.  You have a high number of red blood cells.  You have untreated sleep apnea.  You have a very large prostate.  Supplies needed:  Your health care provider will prescribe the testosterone gel, solution, or medicine that you need. If your health care provider teaches you to do self-injections at home, you will also need: ? Your medicine vial. ? Disposable needles and syringes. ? Alcohol swabs. ? A needle disposal container. ? Adhesive bandages. How to use testosterone replacement therapy Your health care provider will help you find the TRT option that will work best for you based on your preference, the side effects, and the cost. You may:  Rub testosterone gel on your upper arm or shoulder every day after a shower. This is the most common type of TRT. Do not let women or children come in contact with the gel.  Apply a testosterone solution under your arms once each day.  Place a testosterone patch on your skin once each day.  Dissolve a testosterone tablet in your  mouth twice each day.  Have a testosterone pellet inserted under your skin by your health care provider. This will be replaced every 3-6 months.  Use testosterone nasal spray three times each day.  Get testosterone injections. For some types of testosterone, your health care provider will give you this injection. With other types of testosterone, you may be taught to give  injections to yourself. The frequency of injections may vary based on the type of testosterone that you receive.  Follow these instructions at home:  Take over-the-counter and prescription medicines only as told by your health care provider.  Lose weight if you are overweight. Ask your health care provider to help you start a healthy diet and exercise program to reach and maintain a healthy weight.  Work with your health care provider to treat other medical conditions that may lower your testosterone. These include obesity, high blood pressure, high cholesterol, diabetes, liver disease, kidney disease, and sleep apnea.  Keep all follow-up visits as told by your health care provider. This is important. General recommendations  Discuss all risks and benefits with your health care provider before starting therapy.  Work with your health care provider to check your prostate health and do blood testing before you start therapy.  Do not use any testosterone replacement therapies that are not prescribed by your health care provider or not approved for use in the [place]  Do not use TRT for bodybuilding or to improve sexual performance. TRT should be used only to treat symptoms of low testosterone.  Return for all repeat prostate checks and blood tests during therapy, as told by your health care provider. Where to find more information: Learn more about testosterone replacement therapy from:  American Urological Foundation: www.urologyhealth.org/urologic-conditions/low-testosterone-(hypogonadism)  Endocrine Society: www.hormone.org/diseases-and-conditions/mens-health/hypogonadism  Contact a health care provider if:  You have side effects from your testosterone replacement therapy.  You continue to have symptoms of low testosterone during treatment.  You develop new symptoms during treatment. Summary  Testosterone replacement therapy is only for men who have low testosterone as determined  by blood testing and who have symptoms of low testosterone.  Testosterone replacement therapy should be prescribed only by a health care provider and should be used under the supervision of a health care provider.  You may not be able to take testosterone if you have certain medical conditions, including prostate cancer, male breast cancer, or heart disease.  Testosterone replacement therapy may have side effects and may make some medical conditions worse.  Talk with your health care provider about all the risks and benefits before you start therapy. This information is not intended to replace advice given to you by your health care provider. Make sure you discuss any questions you have with your health care provider. Document Released: 06/18/2016 Document Revised: 06/18/2016 Document Reviewed: 06/18/2016 Elsevier Interactive Patient Education  2018 [employer]

## 2017-03-17 ENCOUNTER — Encounter: Payer: Self-pay | Admitting: Family

## 2017-03-17 MED FILL — ATORVASTATIN 10 MG TABLET: 10 | 90 days supply | Qty: 90 | Fill #1

## 2017-03-19 ENCOUNTER — Encounter: Payer: Self-pay | Admitting: Family

## 2017-05-14 DIAGNOSIS — J339 Nasal polyp, unspecified: Secondary | ICD-10-CM | POA: Diagnosis not present

## 2017-05-14 DIAGNOSIS — J342 Deviated nasal septum: Secondary | ICD-10-CM | POA: Diagnosis not present

## 2017-05-14 MED FILL — FLUTICASONE PROP 50 MCG SPR: 50 | 30 days supply | Qty: 16 | Fill #0

## 2017-05-18 DIAGNOSIS — E291 Testicular hypofunction: Secondary | ICD-10-CM | POA: Diagnosis not present

## 2017-05-19 DIAGNOSIS — E291 Testicular hypofunction: Secondary | ICD-10-CM | POA: Diagnosis not present

## 2017-06-01 DIAGNOSIS — E291 Testicular hypofunction: Secondary | ICD-10-CM | POA: Diagnosis not present

## 2017-06-01 MED FILL — CLOMIPHENE CITRATE 50 MG TA: 50 | 30 days supply | Qty: 30 | Fill #0

## 2017-06-08 ENCOUNTER — Telehealth: Payer: 59 | Admitting: Family

## 2017-06-08 DIAGNOSIS — J019 Acute sinusitis, unspecified: Secondary | ICD-10-CM

## 2017-06-08 DIAGNOSIS — B9689 Other specified bacterial agents as the cause of diseases classified elsewhere: Secondary | ICD-10-CM

## 2017-06-08 MED ORDER — AZITHROMYCIN 250 MG PO TABS: 250.0000 mg | ORAL_TABLET | Freq: Every day | ORAL | 0 refills | Status: DC

## 2017-06-08 MED FILL — AZITHROMYCIN 250 MG TAB: 250 | 5 days supply | Qty: 6 | Fill #0

## 2017-06-08 NOTE — Progress Notes (Signed)
We are sorry that you are not feeling well.  Here is how we plan to help!  Based on what you have shared with me it looks like you have sinusitis.  Sinusitis is inflammation and infection in the sinus cavities of the head.  Based on your presentation I believe you most likely have Acute Bacterial Sinusitis.  This is an infection caused by bacteria and is treated with antibiotics. I have prescribed Z-pak as directed. You may use an oral decongestant such as Mucinex D or if you have glaucoma or high blood pressure use plain Mucinex. Saline nasal spray help and can safely be used as often as needed for congestion.  If you develop worsening sinus pain, fever or notice severe headache and vision changes, or if symptoms are not better after completion of antibiotic, please schedule an appointment with a health care provider.    Sinus infections are not as easily transmitted as other respiratory infection, however we still recommend that you avoid close contact with loved ones, especially the very young and elderly.  Remember to wash your hands thoroughly throughout the day as this is the number one way to prevent the spread of infection!  Home Care:  Only take medications as instructed by your medical team.  Complete the entire course of an antibiotic.  Do not take these medications with alcohol.  A steam or ultrasonic humidifier can help congestion.  You can place a towel over your head and breathe in the steam from hot water coming from a faucet.  Avoid close contacts especially the very young and the elderly.  Cover your mouth when you cough or sneeze.  Always remember to wash your hands.  Get Help Right Away If:  You develop worsening fever or sinus pain.  You develop a severe head ache or visual changes.  Your symptoms persist after you have completed your treatment plan.  Make sure you  Understand these instructions.  Will watch your condition.  Will get help right away if you are  not doing well or get worse.  Your e-visit answers were reviewed by a board certified advanced clinical practitioner to complete your personal care plan.  Depending on the condition, your plan could have included both over the counter or prescription medications.  If there is a problem please reply  once you have received a response from your provider.  Your safety is important to us.  If you have drug allergies check your prescription carefully.    You can use MyChart to ask questions about today's visit, request a non-urgent call back, or ask for a work or school excuse for 24 hours related to this e-Visit. If it has been greater than 24 hours you will need to follow up with your provider, or enter a new e-Visit to address those concerns.  You will get an e-mail in the next two days asking about your experience.  I hope that your e-visit has been valuable and will speed your recovery. Thank you for using e-visits.   

## 2017-07-26 DIAGNOSIS — H40023 Open angle with borderline findings, high risk, bilateral: Secondary | ICD-10-CM | POA: Diagnosis not present

## 2017-08-04 ENCOUNTER — Other Ambulatory Visit: Payer: Self-pay | Admitting: Family

## 2017-09-09 DIAGNOSIS — E291 Testicular hypofunction: Secondary | ICD-10-CM | POA: Diagnosis not present

## 2017-09-14 DIAGNOSIS — E291 Testicular hypofunction: Secondary | ICD-10-CM | POA: Diagnosis not present

## 2017-09-24 MED FILL — TESTOSTERONE 20.25 MG/ACT (: 20.25 MG/AC | 30 days supply | Qty: 75 | Fill #0

## 2017-10-11 ENCOUNTER — Telehealth: Payer: 59 | Admitting: Family

## 2017-10-11 DIAGNOSIS — J019 Acute sinusitis, unspecified: Secondary | ICD-10-CM

## 2017-10-11 MED ORDER — AMOXICILLIN-POT CLAVULANATE 875-125 MG PO TABS: 1.0000 | ORAL_TABLET | Freq: Two times a day (BID) | ORAL | 0 refills | Status: DC

## 2017-10-11 MED FILL — AMOX TR-K CLV 875-125 MG TA: 875-125 | 7 days supply | Qty: 14 | Fill #0

## 2017-10-11 NOTE — Progress Notes (Signed)

## 2017-10-13 ENCOUNTER — Encounter: Payer: Self-pay | Admitting: Internal Medicine

## 2017-10-13 ENCOUNTER — Other Ambulatory Visit (INDEPENDENT_AMBULATORY_CARE_PROVIDER_SITE_OTHER): Payer: No Typology Code available for payment source

## 2017-10-13 ENCOUNTER — Other Ambulatory Visit: Payer: Self-pay | Admitting: Internal Medicine

## 2017-10-13 ENCOUNTER — Ambulatory Visit (INDEPENDENT_AMBULATORY_CARE_PROVIDER_SITE_OTHER): Payer: No Typology Code available for payment source | Admitting: Internal Medicine

## 2017-10-13 VITALS — BP 126/88 | HR 63 | Temp 98.5°F | Ht 64.0 in | Wt 176.0 lb

## 2017-10-13 DIAGNOSIS — J3501 Chronic tonsillitis: Secondary | ICD-10-CM | POA: Diagnosis not present

## 2017-10-13 DIAGNOSIS — E782 Mixed hyperlipidemia: Secondary | ICD-10-CM

## 2017-10-13 DIAGNOSIS — J019 Acute sinusitis, unspecified: Secondary | ICD-10-CM | POA: Diagnosis not present

## 2017-10-13 DIAGNOSIS — R5383 Other fatigue: Secondary | ICD-10-CM | POA: Insufficient documentation

## 2017-10-13 DIAGNOSIS — D751 Secondary polycythemia: Secondary | ICD-10-CM | POA: Insufficient documentation

## 2017-10-13 DIAGNOSIS — R0683 Snoring: Secondary | ICD-10-CM | POA: Diagnosis not present

## 2017-10-13 DIAGNOSIS — E291 Testicular hypofunction: Secondary | ICD-10-CM | POA: Insufficient documentation

## 2017-10-13 DIAGNOSIS — J342 Deviated nasal septum: Secondary | ICD-10-CM | POA: Diagnosis not present

## 2017-10-13 DIAGNOSIS — Z0001 Encounter for general adult medical examination with abnormal findings: Secondary | ICD-10-CM

## 2017-10-13 DIAGNOSIS — R7989 Other specified abnormal findings of blood chemistry: Secondary | ICD-10-CM

## 2017-10-13 DIAGNOSIS — Z114 Encounter for screening for human immunodeficiency virus [HIV]: Secondary | ICD-10-CM

## 2017-10-13 DIAGNOSIS — G479 Sleep disorder, unspecified: Secondary | ICD-10-CM | POA: Diagnosis not present

## 2017-10-13 LAB — CBC WITH DIFFERENTIAL/PLATELET
BASOS ABS: 0 10*3/uL (ref 0.0–0.1)
Basophils Relative: 0.4 % (ref 0.0–3.0)
Eosinophils Absolute: 0.4 10*3/uL (ref 0.0–0.7)
Eosinophils Relative: 4.9 % (ref 0.0–5.0)
HCT: 45.4 % (ref 39.0–52.0)
Hemoglobin: 15.6 g/dL (ref 13.0–17.0)
LYMPHS ABS: 2.1 10*3/uL (ref 0.7–4.0)
Lymphocytes Relative: 28.4 % (ref 12.0–46.0)
MCHC: 34.4 g/dL (ref 30.0–36.0)
MCV: 93.9 fl (ref 78.0–100.0)
MONOS PCT: 10.3 % (ref 3.0–12.0)
Monocytes Absolute: 0.8 10*3/uL (ref 0.1–1.0)
NEUTROS PCT: 56 % (ref 43.0–77.0)
Neutro Abs: 4.2 10*3/uL (ref 1.4–7.7)
Platelets: 195 10*3/uL (ref 150.0–400.0)
RBC: 4.84 Mil/uL (ref 4.22–5.81)
RDW: 13.1 % (ref 11.5–15.5)
WBC: 7.5 10*3/uL (ref 4.0–10.5)

## 2017-10-13 LAB — LIPID PANEL
CHOL/HDL RATIO: 8
Cholesterol: 207 mg/dL — ABNORMAL HIGH (ref 0–200)
HDL: 26.1 mg/dL — ABNORMAL LOW (ref >39.00)
NONHDL: 180.69
TRIGLYCERIDES: 322 mg/dL — AB (ref 0.0–149.0)
VLDL: 64.4 mg/dL — ABNORMAL HIGH (ref 0.0–40.0)

## 2017-10-13 LAB — LDL CHOLESTEROL, DIRECT: LDL DIRECT: 137 mg/dL

## 2017-10-13 MED ORDER — ATORVASTATIN CALCIUM 20 MG PO TABS: 20.0000 mg | ORAL_TABLET | Freq: Every day | ORAL | 3 refills | Status: DC

## 2017-10-13 MED ORDER — TESTOSTERONE 20.25 MG/ACT (1.62%) TD GEL: TRANSDERMAL | 5 refills | Status: DC

## 2017-10-13 MED ORDER — ATORVASTATIN CALCIUM 10 MG PO TABS: 10.0000 mg | ORAL_TABLET | Freq: Every day | ORAL | 3 refills | Status: DC

## 2017-10-13 MED ORDER — DOXYCYCLINE HYCLATE 100 MG PO TABS: 100.0000 mg | ORAL_TABLET | Freq: Two times a day (BID) | ORAL | 0 refills | Status: DC

## 2017-10-13 MED FILL — DOXYCYCLINE HYCLATE 100 MG: 100 | 10 days supply | Qty: 20 | Fill #0

## 2017-10-13 NOTE — Addendum Note (Signed)
Addended by: Jannifer RodneyHAWKS, Loriene Taunton A on: 10/13/2017 09:34 AM   Modules accepted: Orders

## 2017-10-13 NOTE — Assessment & Plan Note (Signed)
Chronic persistent, asympt except I suspect possible involvement in upper airway resistance leading to a type of OSA, for ONO as above

## 2017-10-13 NOTE — Assessment & Plan Note (Signed)
Chronic persistent, has seen ENT in past, declines f/u for now

## 2017-10-13 NOTE — Assessment & Plan Note (Signed)
With other s/s could be c/w OSA, pt declines pulm referral but will do Overnight Oximetry per Home Health  

## 2017-10-13 NOTE — Assessment & Plan Note (Signed)
Does plan to f/u with urology soon, I suggested a referral to Endo if testosterone levels not improved

## 2017-10-13 NOTE — Assessment & Plan Note (Signed)
Persistent, agree with change of antibiotic

## 2017-10-13 NOTE — Progress Notes (Signed)
Subjective:    Patient ID: Donald Swanson, male    DOB: 1981/09/26, 37 y.o.   MRN: [id number]  HPI  Here for wellness and f/u;  Overall doing ok;  Pt denies Chest pain, worsening SOB, DOE, wheezing, orthopnea, PND, worsening LE edema, palpitations, dizziness or syncope.  Pt denies neurological change such as new headache, facial or extremity weakness.  Pt denies polydipsia, polyuria, or low sugar symptoms. Pt states overall good compliance with treatment and medications, good tolerability, and has been trying to follow appropriate diet.  Pt denies worsening depressive symptoms, suicidal ideation or panic. No fever, night sweats, wt loss, loss of appetite, or other constitutional symptoms.  Pt states good ability with ADL's, has low fall risk, home safety reviewed and adequate, no other significant changes in hearing or vision, and occasionally active with exercise. Recent Sinusitis not better with zpack, but now has doxy course per NP sent to pharmacy today, has not yet started.  Being tx with androgel for low testost per urology since aug 2018 with mult labs done initially and f/u lab include testosterone not improving well, changed from IM to now androgel.  Most recent lab here c/w polycythemia./  Also c/o ongoing fatigue, snoring, sleep disturbance with concomitant known chronic tonsillitis and deviated nasal septum.  No other new complaints or interval hx Past Medical History:  Diagnosis Date  . Hyperlipemia   . Low testosterone    Past Surgical History:  Procedure Laterality Date  . KNEE ARTHROSCOPY WITH MEDIAL MENISECTOMY Right 06/10/2015   Procedure: knee arthroscopy with medial menisectomy  ;  Surgeon: Gean Birchwood, MD;  Location: [facility];  Service: Orthopedics;  Laterality: Right;    reports that he has quit smoking. His smoking use included cigarettes. He has a 2.25 pack-year smoking history. he has never used smokeless tobacco. He reports that he drinks alcohol. He  reports that he does not use drugs. family history includes COPD in his mother. No Known Allergies Current Outpatient Medications on File Prior to Visit  Medication Sig Dispense Refill  . doxycycline (VIBRA-TABS) 100 MG tablet Take 1 tablet (100 mg total) by mouth 2 (two) times daily. 20 tablet 0   No current facility-administered medications on file prior to visit.    Review of Systems Constitutional: Negative for other unusual diaphoresis, sweats, appetite or weight changes HENT: Negative for other worsening hearing loss, ear pain, facial swelling, mouth sores or neck stiffness.   Eyes: Negative for other worsening pain, redness or other visual disturbance.  Respiratory: Negative for other stridor or swelling Cardiovascular: Negative for other palpitations or other chest pain  Gastrointestinal: Negative for worsening diarrhea or loose stools, blood in stool, distention or other pain Genitourinary: Negative for hematuria, flank pain or other change in urine volume.  Musculoskeletal: Negative for myalgias or other joint swelling.  Skin: Negative for other color change, or other wound or worsening drainage.  Neurological: Negative for other syncope or numbness. Hematological: Negative for other adenopathy or swelling Psychiatric/Behavioral: Negative for hallucinations, other worsening agitation, SI, self-injury, or new decreased concentration All other system neg p erpt    Objective:   Physical Exam BP 126/88   Pulse 63   Temp 98.5 F (36.9 C) (Oral)   Ht 5\' 4"  (1.626 m)   Wt 176 lb (79.8 kg)   SpO2 99%   BMI 30.21 kg/m  VS noted,  Constitutional: Pt is oriented to person, place, and time. Appears well-developed and well-nourished, in no significant  distress and comfortable Head: Normocephalic and atraumatic  Eyes: Conjunctivae and EOM are normal. Pupils are equal, round, and reactive to light Right Ear: External ear normal without discharge Left Ear: External ear normal  without discharge Nose: Nose without discharge or deformity Mouth/Throat: Oropharynx is without other ulcerations and moist but with large 2+ chronic tonsil enlargement Neck: Normal range of motion. Neck supple. No JVD present. No tracheal deviation present or significant neck LA or mass Cardiovascular: Normal rate, regular rhythm, normal heart sounds and intact distal pulses.   Pulmonary/Chest: WOB normal and breath sounds without rales or wheezing  Abdominal: Soft. Bowel sounds are normal. NT. No HSM  Musculoskeletal: Normal range of motion. Exhibits no edema Lymphadenopathy: Has no other cervical adenopathy.  Neurological: Pt is alert and oriented to person, place, and time. Pt has normal reflexes. No cranial nerve deficit. Motor grossly intact, Gait intact Skin: Skin is warm and dry. No rash noted or new ulcerations Psychiatric:  Has normal mood and affect. Behavior is normal without agitation No other exam findings    Assessment & Plan:

## 2017-10-13 NOTE — Assessment & Plan Note (Signed)

## 2017-10-13 NOTE — Patient Instructions (Signed)
Please continue all other medications as before, and refills have been done if requested.  Please have the pharmacy call with any other refills you may need.  Please continue your efforts at being more active, low cholesterol diet, and weight control.  You are otherwise up to date with prevention measures today.  Please keep your appointments with your specialists as you may have planned  You will be contacted regarding the referral for: Overnight oximetry per Home Health  Please go to the LAB in the Basement (turn left off the elevator) for the tests to be done today  You will be contacted by phone if any changes need to be made immediately.  Otherwise, you will receive a letter about your results with an explanation, but please check with MyChart first.  Please remember to sign up for MyChart if you have not done so, as this will be important to you in the future with finding out test results, communicating by private email, and scheduling acute appointments online when needed.  Please return in 1 year for your yearly visit, or sooner if needed

## 2017-10-13 NOTE — Assessment & Plan Note (Signed)
?   Etiology unclear, ? Androgen replacement related vs OSA vs other - for f/u lab today

## 2017-10-13 NOTE — Assessment & Plan Note (Signed)
With other s/s could be c/w OSA, pt declines pulm referral but will do Overnight Oximetry per Home Health

## 2017-10-13 NOTE — Assessment & Plan Note (Addendum)
To cont low chol diet, f/u lipids today, goal LDL < 100  In addition to the time spent performing CPE, I spent an additional 25 minutes face to face,in which greater than 50% of this time was spent in counseling and coordination of care for patient's acute illness as documented, including the differential dx, treatment, further evaluation and other management of HLD, hypogonadism, deviated nasal septum, fatigue, snoring, sleep disturbance, sinusitis and polycythemia

## 2017-10-14 ENCOUNTER — Telehealth: Payer: Self-pay

## 2017-10-14 LAB — HIV ANTIBODY (ROUTINE TESTING W REFLEX): HIV: NONREACTIVE

## 2017-10-14 MED FILL — ATORVASTATIN 20 MG TABLET: 20 | 90 days supply | Qty: 90 | Fill #0

## 2017-10-14 NOTE — Telephone Encounter (Signed)
Pt informed and expressed understanding. 

## 2017-10-14 NOTE — Telephone Encounter (Signed)
-----   Message from Corwin LevinsJames W John, MD sent at 10/13/2017  6:54 PM EST ----- Left message on MyChart, pt to cont same tx except  The test results show that your current treatment is OK, except the LDL cholesterol is mild to mod elevated.  Please increase the lipitor to 20 mg.  I will send a new prescription and you should be called from the office as well   Shirron to please inform pt, I will do rx

## 2017-11-03 MED FILL — TESTOSTERONE 20.25 MG/ACT (: 20.25 MG/AC | 30 days supply | Qty: 75 | Fill #1

## 2017-11-26 ENCOUNTER — Telehealth: Payer: No Typology Code available for payment source | Admitting: Family

## 2017-11-26 ENCOUNTER — Encounter: Payer: Self-pay | Admitting: Internal Medicine

## 2017-11-26 DIAGNOSIS — B9689 Other specified bacterial agents as the cause of diseases classified elsewhere: Secondary | ICD-10-CM | POA: Diagnosis not present

## 2017-11-26 DIAGNOSIS — J028 Acute pharyngitis due to other specified organisms: Secondary | ICD-10-CM | POA: Diagnosis not present

## 2017-11-26 MED ORDER — AZITHROMYCIN 250 MG PO TABS: ORAL_TABLET | ORAL | 0 refills | Status: DC

## 2017-11-26 MED ORDER — BENZONATATE 100 MG PO CAPS: 100.0000 mg | ORAL_CAPSULE | Freq: Three times a day (TID) | ORAL | 0 refills | Status: DC | PRN

## 2017-11-26 MED ORDER — PREDNISONE 5 MG PO TABS: 5.0000 mg | ORAL_TABLET | ORAL | 0 refills | Status: DC

## 2017-11-26 NOTE — Progress Notes (Signed)
Thank you for the details you included in the comment boxes. Those details are very helpful in determining the best course of treatment for you and help us to provide the best care.  We are sorry that you are not feeling well.  Here is how we plan to help!  Based on your presentation I believe you most likely have A cough and sinus infection due to bacteria.  When patients have a fever and a productive cough with a change in color or increased sputum production, we are concerned about bacterial bronchitis.  If left untreated it can progress to pneumonia.  If your symptoms do not improve with your treatment plan it is important that you contact your provider.   I have prescribed Azithromyin 250 mg: two tablets now and then one tablet daily for 4 additonal days    In addition you may use A non-prescription cough medication called Mucinex DM: take 2 tablets every 12 hours. and A prescription cough medication called Tessalon Perles 100mg . You may take 1-2 capsules every 8 hours as needed for your cough.  Sterapred 5 mg dosepak  From your responses in the eVisit questionnaire you describe inflammation in the upper respiratory tract which is causing a significant cough.  This is commonly called Bronchitis and has four common causes:    Allergies  Viral Infections  Acid Reflux  Bacterial Infection Allergies, viruses and acid reflux are treated by controlling symptoms or eliminating the cause. An example might be a cough caused by taking certain blood pressure medications. You stop the cough by changing the medication. Another example might be a cough caused by acid reflux. Controlling the reflux helps control the cough.  USE OF BRONCHODILATOR ("RESCUE") INHALERS: There is a risk from using your bronchodilator too frequently.  The risk is that over-reliance on a medication which only relaxes the muscles surrounding the breathing tubes can reduce the effectiveness of medications prescribed to reduce  swelling and congestion of the tubes themselves.  Although you feel brief relief from the bronchodilator inhaler, your asthma may actually be worsening with the tubes becoming more swollen and filled with mucus.  This can delay other crucial treatments, such as oral steroid medications. If you need to use a bronchodilator inhaler daily, several times per day, you should discuss this with your provider.  There are probably better treatments that could be used to keep your asthma under control.     HOME CARE . Only take medications as instructed by your medical team. . Complete the entire course of an antibiotic. . Drink plenty of fluids and get plenty of rest. . Avoid close contacts especially the very young and the elderly . Cover your mouth if you cough or cough into your sleeve. . Always remember to wash your hands . A steam or ultrasonic humidifier can help congestion.   GET HELP RIGHT AWAY IF: . You develop worsening fever. . You become short of breath . You cough up blood. . Your symptoms persist after you have completed your treatment plan MAKE SURE YOU   Understand these instructions.  Will watch your condition.  Will get help right away if you are not doing well or get worse.  Your e-visit answers were reviewed by a board certified advanced clinical practitioner to complete your personal care plan.  Depending on the condition, your plan could have included both over the counter or prescription medications. If there is a problem please reply  once you have received a response from  your provider. Your safety is important to [place].  If you have drug allergies check your prescription carefully.    You can use MyChart to ask questions about today's visit, request a non-urgent call back, or ask for a work or school excuse for 24 hours related to this e-Visit. If it has been greater than 24 hours you will need to follow up with your provider, or enter a new e-Visit to address those  concerns. You will get an e-mail in the next two days asking about your experience.  I hope that your e-visit has been valuable and will speed your recovery. Thank you for using e-visits.

## 2017-12-07 MED FILL — TESTOSTERONE 20.25 MG/ACT (: 20.25 MG/AC | 30 days supply | Qty: 75 | Fill #2

## 2017-12-17 ENCOUNTER — Encounter: Payer: Self-pay | Admitting: Internal Medicine

## 2017-12-17 NOTE — Telephone Encounter (Addendum)
Per chart Dr. Jonny RuizJohn you ordered an Overnight Oximetry per Home Health Larena Sox([facility]) when pt saw you for their cpx back in January. The report is scan up in the media tab dated 11/13/17....Raechel Chute/lmb

## 2018-01-06 ENCOUNTER — Other Ambulatory Visit: Payer: Self-pay | Admitting: Family

## 2018-01-06 MED FILL — VENLAFAXINE HCL ER 75 MG CA: 75 | 30 days supply | Qty: 30 | Fill #0

## 2018-01-11 MED FILL — TESTOSTERONE 20.25 MG/ACT (: 20.25 MG/AC | 30 days supply | Qty: 75 | Fill #0

## 2018-01-27 ENCOUNTER — Ambulatory Visit (INDEPENDENT_AMBULATORY_CARE_PROVIDER_SITE_OTHER): Payer: No Typology Code available for payment source | Admitting: Internal Medicine

## 2018-01-27 ENCOUNTER — Encounter: Payer: Self-pay | Admitting: Internal Medicine

## 2018-01-27 VITALS — BP 114/78 | HR 67 | Temp 98.5°F | Ht 64.0 in | Wt 179.0 lb

## 2018-01-27 DIAGNOSIS — E785 Hyperlipidemia, unspecified: Secondary | ICD-10-CM | POA: Diagnosis not present

## 2018-01-27 DIAGNOSIS — K589 Irritable bowel syndrome without diarrhea: Secondary | ICD-10-CM | POA: Insufficient documentation

## 2018-01-27 DIAGNOSIS — J3501 Chronic tonsillitis: Secondary | ICD-10-CM

## 2018-01-27 DIAGNOSIS — J309 Allergic rhinitis, unspecified: Secondary | ICD-10-CM

## 2018-01-27 MED ORDER — MONTELUKAST SODIUM 10 MG PO TABS: 10.0000 mg | ORAL_TABLET | Freq: Every day | ORAL | 3 refills | Status: DC

## 2018-01-27 MED ORDER — METHYLPREDNISOLONE ACETATE 80 MG/ML IJ SUSP
80.0000 mg | Freq: Once | INTRAMUSCULAR | Status: AC
  Administered 2018-01-27: 80 mg via INTRAMUSCULAR

## 2018-01-27 MED ORDER — DICYCLOMINE HCL 10 MG PO CAPS: 10.0000 mg | ORAL_CAPSULE | Freq: Three times a day (TID) | ORAL | 5 refills | Status: DC

## 2018-01-27 MED FILL — DICYCLOMINE 10 MG CAPSULE: 10 | 30 days supply | Qty: 120 | Fill #0

## 2018-01-27 MED FILL — MONTELUKAST SOD 10 MG TAB: 10 | 90 days supply | Qty: 90 | Fill #0

## 2018-01-27 NOTE — Assessment & Plan Note (Signed)
Chronic stable,  to f/u any worsening symptoms or concerns 

## 2018-01-27 NOTE — Assessment & Plan Note (Signed)
Stable, cont bentyl prn

## 2018-01-27 NOTE — Assessment & Plan Note (Signed)
Encouraged statin compliance, lower chol diet

## 2018-01-27 NOTE — Progress Notes (Signed)
Subjective:    Patient ID: Donald Swanson, male    DOB: January 10, 1981, 37 y.o.   MRN: [id number]  HPI  Here to f/u; overall doing ok,  Pt denies chest pain, increasing sob or doe, wheezing, orthopnea, PND, increased LE swelling, palpitations, dizziness or syncope.  Pt denies new neurological symptoms such as new headache, or facial or extremity weakness or numbness.  Pt denies polydipsia, polyuria, or low sugar episode.  Pt states overall good compliance with meds, mostly trying to follow appropriate diet, with wt overall stable.  Does have several wks ongoing nasal allergy symptoms with clearish congestion, itch and sneezing, without fever, pain, ST, cough, swelling or wheezing. Trying to hold off on nasal septum surgury per ENT recommendation.  States only taking statin about twice per wk. Does c/o ongoing fatigue, but denies signficant daytime hypersomnolence. Denies worsening reflux, abd pain, dysphagia, n/v, bowel change or blood, asks for refill bentyl.  Did have ONO negative for significant desaturations feb 2019  No other interval hx or new complaint Past Medical History:  Diagnosis Date  . Hyperlipemia   . Low testosterone    Past Surgical History:  Procedure Laterality Date  . KNEE ARTHROSCOPY WITH MEDIAL MENISECTOMY Right 06/10/2015   Procedure: knee arthroscopy with medial menisectomy  ;  Surgeon: Gean BirchwoodFrank Rowan, MD;  Location: [facility];  Service: Orthopedics;  Laterality: Right;    reports that he has quit smoking. His smoking use included cigarettes. He has a 2.25 pack-year smoking history. He has never used smokeless tobacco. He reports that he drinks alcohol. He reports that he does not use drugs. family history includes COPD in his mother. No Known Allergies Current Outpatient Medications on File Prior to Visit  Medication Sig Dispense Refill  . atorvastatin (LIPITOR) 20 MG tablet Take 1 tablet (20 mg total) by mouth daily. 90 tablet 3  . Testosterone (ANDROGEL  PUMP) 20.25 MG/ACT (1.62%) GEL 1 pump each arm daily 75 g 5   No current facility-administered medications on file prior to visit.    Review of Systems  Constitutional: Negative for other unusual diaphoresis or sweats HENT: Negative for ear discharge or swelling Eyes: Negative for other worsening visual disturbances Respiratory: Negative for stridor or other swelling  Gastrointestinal: Negative for worsening distension or other blood Genitourinary: Negative for retention or other urinary change Musculoskeletal: Negative for other MSK pain or swelling Skin: Negative for color change or other new lesions Neurological: Negative for worsening tremors and other numbness  Psychiatric/Behavioral: Negative for worsening agitation or other fatigue All other system neg per pt    Objective:   Physical Exam BP 114/78   Pulse 67   Temp 98.5 F (36.9 C) (Oral)   Ht 5\' 4"  (1.626 m)   Wt 179 lb (81.2 kg)   SpO2 95%   BMI 30.73 kg/m  VS noted,  Constitutional: Pt appears in NAD HENT: Head: NCAT.  Right Ear: External ear normal.  Left Ear: External ear normal.  Eyes: . Pupils are equal, round, and reactive to light. Conjunctivae and EOM are normal Nose: without d/c or deformity Neck: Neck supple. Gross normal ROM Cardiovascular: Normal rate and regular rhythm.   Pulmonary/Chest: Effort normal and breath sounds without rales or wheezing.  Neurological: Pt is alert. At baseline orientation, motor grossly intact Skin: Skin is warm. No rashes, other new lesions, no LE edema Psychiatric: Pt behavior is normal without agitation  No other exam findings    Assessment & Plan:

## 2018-01-27 NOTE — Patient Instructions (Addendum)
Please take the statin at least every other day  You had the steroid shot today  Please take all new medication as prescribed - the singulair 10 mg daily  Please continue all other medications as before, and refills have been done if requested.  Please have the pharmacy call with any other refills you may need.  Please continue your efforts at being more active, low cholesterol diet, and weight control.  Please keep your appointments with your specialists as you may have planned  Please call if you want the referral to ENT for the nasal septum

## 2018-01-27 NOTE — Assessment & Plan Note (Addendum)
antihist too sedating, for depomedrol IM 80, also singulair 10 qd,  to f/u any worsening symptoms or concerns

## 2018-02-14 MED FILL — TESTOSTERONE 20.25 MG/ACT (: 20.25 MG/AC | 30 days supply | Qty: 75 | Fill #1

## 2018-03-22 MED FILL — TESTOSTERONE 20.25 MG/ACT (: 20.25 MG/AC | 30 days supply | Qty: 75 | Fill #2

## 2018-04-11 ENCOUNTER — Telehealth: Payer: No Typology Code available for payment source | Admitting: Nurse Practitioner

## 2018-04-11 ENCOUNTER — Encounter: Payer: Self-pay | Admitting: Internal Medicine

## 2018-04-11 ENCOUNTER — Ambulatory Visit (INDEPENDENT_AMBULATORY_CARE_PROVIDER_SITE_OTHER): Payer: No Typology Code available for payment source | Admitting: Internal Medicine

## 2018-04-11 DIAGNOSIS — R21 Rash and other nonspecific skin eruption: Secondary | ICD-10-CM | POA: Insufficient documentation

## 2018-04-11 MED ORDER — KETOCONAZOLE 2 % EX CREA: 1.0000 "application " | TOPICAL_CREAM | Freq: Every day | CUTANEOUS | 0 refills | Status: DC | PRN

## 2018-04-11 MED ORDER — KETOCONAZOLE 200 MG PO TABS: 200.0000 mg | ORAL_TABLET | Freq: Every day | ORAL | 0 refills | Status: DC

## 2018-04-11 MED FILL — KETOCONAZOLE 200 MG TABLET: 200 | 7 days supply | Qty: 7 | Fill #0

## 2018-04-11 MED FILL — KETOCONAZOLE 2% CREAM: 2 | 30 days supply | Qty: 30 | Fill #0

## 2018-04-11 NOTE — Assessment & Plan Note (Signed)
C/w tinea versicolor, for topical rash to reachable areas, and ketoconozole 200 qd x 7 days only; to hold lipitor when taking antifungal

## 2018-04-11 NOTE — Patient Instructions (Addendum)
Please take all new medication as prescribed- the cream and the pill medication for the rash (tinea)  Please do not take the lipitor during the time you are taking the pills  Please continue all other medications as before, and refills have been done if requested.  Please have the pharmacy call with any other refills you may need.  Please keep your appointments with your specialists as you may have planned

## 2018-04-11 NOTE — Progress Notes (Signed)
   Subjective:    Patient ID: Donald SealMatthew T Germond, male    DOB: 1980-11-15, 37 y.o.   MRN: [id number]  HPI  Here with rash mostly to the diffuse back, red pink with itchiness and relativly larger area to left inner elbow for 2 wks; seemed worse after work on hot days where he cannot change the work uniform more than once daily.   Pt denies fever, wt loss, night sweats, loss of appetite, or other constitutional symptoms  Pt denies chest pain, increased sob or doe, wheezing, orthopnea, PND, increased LE swelling, palpitations, dizziness or syncope. Past Medical History:  Diagnosis Date  . Hyperlipemia   . Low testosterone    Past Surgical History:  Procedure Laterality Date  . KNEE ARTHROSCOPY WITH MEDIAL MENISECTOMY Right 06/10/2015   Procedure: knee arthroscopy with medial menisectomy  ;  Surgeon: Gean BirchwoodFrank Rowan, MD;  Location: [facility];  Service: Orthopedics;  Laterality: Right;    reports that he has quit smoking. His smoking use included cigarettes. He has a 2.25 pack-year smoking history. He has never used smokeless tobacco. He reports that he drinks alcohol. He reports that he does not use drugs. family history includes COPD in his mother. No Known Allergies Current Outpatient Medications on File Prior to Visit  Medication Sig Dispense Refill  . atorvastatin (LIPITOR) 20 MG tablet Take 1 tablet (20 mg total) by mouth daily. 90 tablet 3  . dicyclomine (BENTYL) 10 MG capsule Take 1 capsule (10 mg total) by mouth 4 (four) times daily -  before meals and at bedtime. 120 capsule 5  . montelukast (SINGULAIR) 10 MG tablet Take 1 tablet (10 mg total) by mouth daily. 90 tablet 3  . Testosterone (ANDROGEL PUMP) 20.25 MG/ACT (1.62%) GEL 1 pump each arm daily 75 g 5   No current facility-administered medications on file prior to visit.    Review of Systems  All otherwise neg per pt    Objective:   Physical Exam BP 112/76   Pulse (!) 52   Temp 98.4 F (36.9 C) (Oral)   Ht 5'  4" (1.626 m)   Wt 177 lb (80.3 kg)   SpO2 97%   BMI 30.38 kg/m  VS noted,  Constitutional: Pt appears in NAD HENT: Head: NCAT.  Right Ear: External ear normal.  Left Ear: External ear normal.  Eyes: . Pupils are equal, round, and reactive to light. Conjunctivae and EOM are normal Nose: without d/c or deformity Neck: Neck supple. Gross normal ROM Cardiovascular: Normal rate and regular rhythm.   Pulmonary/Chest: Effort normal and breath sounds without rales or wheezing.  Neurological: Pt is alert. At baseline orientation, motor grossly intact Skin: Skin is warm. + typical tinea rashes diffuse back as well as left inner elbow area, nontender, other new lesions, no LE edema Psychiatric: Pt behavior is normal without agitation  No other exam findings    Assessment & Plan:

## 2018-04-11 NOTE — Progress Notes (Signed)
Based on what you shared with me it looks like you have a serious condition that should be evaluated in a face to face office visit.  NOTE: If you entered your credit card information for this eVisit, you will not be charged. You may see a "hold" on your card for the $30 but that hold will drop off and you will not have a charge processed.  If you are having a true medical emergency please call 911.  If you need an urgent face to face visit, [facility] has four urgent care centers for your convenience.  If you need care fast and have a high deductible or no insurance consider:   [email or web address] to reserve your spot online an avoid wait times  [facility] [address], [address] [place], [place] [address] 8 am to 8 pm Monday-Friday 10 am to 4 pm Saturday-Sunday *Across the street from [employer]  [facility]  [address] [place], [address] 8 am to 5 pm Monday-Friday * In the [facility]   The following sites will take your  insurance:  . [facility]  [phone] Get Driving Directions Find a Provider at this Location  [address] [place], [place] [address] . 10 am to 8 pm Monday-Friday . 12 pm to 8 pm Saturday-Sunday   . [facility]  [phone] Get Driving Directions Find a Provider at this Location  [address], [address] [place], [place] [address] . 8 am to 8 pm Monday-Friday . 9 am to 6 pm Saturday . 11 am to 6 pm Sunday   . [facility] [place]  [phone] Get Driving Directions  [address] [place], [place] [address] . 8 am to 8 pm Monday-Friday . 8 am to 4 pm Saturday-Sunday   Your e-visit answers were reviewed by a board certified advanced clinical practitioner to complete your personal care plan.  Thank you for using e-Visits.  

## 2018-04-28 MED FILL — ATORVASTATIN CALCIUM 20 MG: 20 | 90 days supply | Qty: 90 | Fill #1

## 2018-05-02 MED FILL — TESTOSTERONE 20.25 MG/ACT (: 20.25 MG/AC | 30 days supply | Qty: 75 | Fill #0

## 2018-06-09 MED FILL — TESTOSTERONE 20.25 MG/ACT (: 20.25 MG/AC | 30 days supply | Qty: 75 | Fill #1

## 2018-07-18 MED FILL — TESTOSTERONE 20.25 MG/ACT (: 20.25 MG/AC | 20 days supply | Qty: 75 | Fill #0

## 2018-07-24 ENCOUNTER — Telehealth: Payer: No Typology Code available for payment source | Admitting: Family

## 2018-07-24 DIAGNOSIS — J029 Acute pharyngitis, unspecified: Secondary | ICD-10-CM | POA: Diagnosis not present

## 2018-07-24 MED ORDER — BENZONATATE 100 MG PO CAPS: 100.0000 mg | ORAL_CAPSULE | Freq: Three times a day (TID) | ORAL | 0 refills | Status: DC | PRN

## 2018-07-24 MED ORDER — PREDNISONE 5 MG PO TABS: 5.0000 mg | ORAL_TABLET | ORAL | 0 refills | Status: DC

## 2018-07-24 NOTE — Progress Notes (Signed)
Thank you for the details you included in the comment boxes. Those details are very helpful in determining the best course of treatment for you and help [place] to provide the best care.  Providers prescribe antibiotics to treat infections caused by bacteria. Antibiotics are very powerful in treating bacterial infections when they are used properly. To maintain their effectiveness, they should be used only when necessary. Overuse of antibiotics has resulted in the development of superbugs that are resistant to treatment!    After careful review of your answers, I would not recommend an antibiotic for your condition.  Antibiotics are not effective against viruses and therefore should not be used to treat them. Common examples of infections caused by viruses include colds and flu in addition to most respiratory infections less than 7 days.   We are sorry that you are not feeling well.  Here is how we plan to help!  Based on your presentation I believe you most likely have A cough due to a virus.  This is called viral bronchitis and is best treated by rest, plenty of fluids and control of the cough.  You may use Ibuprofen or Tylenol as directed to help your symptoms.     In addition you may use A non-prescription cough medication called Mucinex DM: take 2 tablets every 12 hours. and A prescription cough medication called Tessalon Perles 100mg . You may take 1-2 capsules every 8 hours as needed for your cough.  Prednisone 5 mg daily for 6 days (see taper instructions below)  Directions for 6 day taper: Day 1: 2 tablets before breakfast, 1 after both lunch & dinner and 2 at bedtime Day 2: 1 tab before breakfast, 1 after both lunch & dinner and 2 at bedtime Day 3: 1 tab at each meal & 1 at bedtime Day 4: 1 tab at breakfast, 1 at lunch, 1 at bedtime Day 5: 1 tab at breakfast & 1 tab at bedtime Day 6: 1 tab at breakfast   From your responses in the eVisit questionnaire you describe inflammation in the upper  respiratory tract which is causing a significant cough.  This is commonly called Bronchitis and has four common causes:    Allergies  Viral Infections  Acid Reflux  Bacterial Infection Allergies, viruses and acid reflux are treated by controlling symptoms or eliminating the cause. An example might be a cough caused by taking certain blood pressure medications. You stop the cough by changing the medication. Another example might be a cough caused by acid reflux. Controlling the reflux helps control the cough.  USE OF BRONCHODILATOR ("RESCUE") INHALERS: There is a risk from using your bronchodilator too frequently.  The risk is that over-reliance on a medication which only relaxes the muscles surrounding the breathing tubes can reduce the effectiveness of medications prescribed to reduce swelling and congestion of the tubes themselves.  Although you feel brief relief from the bronchodilator inhaler, your asthma may actually be worsening with the tubes becoming more swollen and filled with mucus.  This can delay other crucial treatments, such as oral steroid medications. If you need to use a bronchodilator inhaler daily, several times per day, you should discuss this with your provider.  There are probably better treatments that could be used to keep your asthma under control.     HOME CARE . Only take medications as instructed by your medical team. . Complete the entire course of an antibiotic. . Drink plenty of fluids and get plenty of rest. . Avoid  close contacts especially the very young and the elderly . Cover your mouth if you cough or cough into your sleeve. . Always remember to wash your hands . A steam or ultrasonic humidifier can help congestion.   GET HELP RIGHT AWAY IF: . You develop worsening fever. . You become short of breath . You cough up blood. . Your symptoms persist after you have completed your treatment plan MAKE SURE YOU   Understand these instructions.  Will watch  your condition.  Will get help right away if you are not doing well or get worse.  Your e-visit answers were reviewed by a board certified advanced clinical practitioner to complete your personal care plan.  Depending on the condition, your plan could have included both over the counter or prescription medications. If there is a problem please reply  once you have received a response from your provider. Your safety is important to [place].  If you have drug allergies check your prescription carefully.    You can use MyChart to ask questions about today's visit, request a non-urgent call back, or ask for a work or school excuse for 24 hours related to this e-Visit. If it has been greater than 24 hours you will need to follow up with your provider, or enter a new e-Visit to address those concerns. You will get an e-mail in the next two days asking about your experience.  I hope that your e-visit has been valuable and will speed your recovery. Thank you for using e-visits.

## 2018-07-31 ENCOUNTER — Telehealth: Payer: No Typology Code available for payment source | Admitting: Family

## 2018-07-31 DIAGNOSIS — B9689 Other specified bacterial agents as the cause of diseases classified elsewhere: Secondary | ICD-10-CM | POA: Diagnosis not present

## 2018-07-31 DIAGNOSIS — J029 Acute pharyngitis, unspecified: Secondary | ICD-10-CM

## 2018-07-31 DIAGNOSIS — J028 Acute pharyngitis due to other specified organisms: Secondary | ICD-10-CM | POA: Diagnosis not present

## 2018-07-31 MED ORDER — AZITHROMYCIN 250 MG PO TABS: ORAL_TABLET | ORAL | 0 refills | Status: DC

## 2018-07-31 MED ORDER — BENZONATATE 100 MG PO CAPS: 100.0000 mg | ORAL_CAPSULE | Freq: Three times a day (TID) | ORAL | 0 refills | Status: DC | PRN

## 2018-07-31 NOTE — Progress Notes (Signed)

## 2018-08-16 MED FILL — TESTOSTERONE 20.25 MG/ACT (: 20.25 MG/AC | 20 days supply | Qty: 75 | Fill #1

## 2018-08-30 MED FILL — ATORVASTATIN CALCIUM 20 MG: 20 | 90 days supply | Qty: 90 | Fill #2

## 2018-09-12 MED FILL — TESTOSTERONE 20.25 MG/ACT (: 20.25 MG/AC | 20 days supply | Qty: 75 | Fill #2

## 2018-09-30 ENCOUNTER — Ambulatory Visit: Payer: No Typology Code available for payment source | Admitting: Internal Medicine

## 2018-10-06 MED FILL — TESTOSTERONE 20.25 MG/ACT (: 20.25 MG/AC | 20 days supply | Qty: 75 | Fill #3

## 2018-10-17 ENCOUNTER — Ambulatory Visit: Payer: 59 | Admitting: Internal Medicine

## 2018-10-18 ENCOUNTER — Encounter: Payer: Self-pay | Admitting: Internal Medicine

## 2018-10-18 ENCOUNTER — Ambulatory Visit (INDEPENDENT_AMBULATORY_CARE_PROVIDER_SITE_OTHER): Payer: No Typology Code available for payment source | Admitting: Internal Medicine

## 2018-10-18 VITALS — BP 102/70 | HR 53 | Temp 97.9°F | Ht 64.0 in | Wt 181.0 lb

## 2018-10-18 DIAGNOSIS — E785 Hyperlipidemia, unspecified: Secondary | ICD-10-CM | POA: Diagnosis not present

## 2018-10-18 DIAGNOSIS — F329 Major depressive disorder, single episode, unspecified: Secondary | ICD-10-CM | POA: Diagnosis not present

## 2018-10-18 DIAGNOSIS — R7989 Other specified abnormal findings of blood chemistry: Secondary | ICD-10-CM | POA: Diagnosis not present

## 2018-10-18 DIAGNOSIS — Z0001 Encounter for general adult medical examination with abnormal findings: Secondary | ICD-10-CM

## 2018-10-18 DIAGNOSIS — F32A Depression, unspecified: Secondary | ICD-10-CM | POA: Insufficient documentation

## 2018-10-18 DIAGNOSIS — F419 Anxiety disorder, unspecified: Secondary | ICD-10-CM | POA: Insufficient documentation

## 2018-10-18 MED ORDER — CITALOPRAM HYDROBROMIDE 20 MG PO TABS: 20.0000 mg | ORAL_TABLET | Freq: Every day | ORAL | 3 refills | Status: DC

## 2018-10-18 MED ORDER — DICYCLOMINE HCL 10 MG PO CAPS: 10.0000 mg | ORAL_CAPSULE | Freq: Three times a day (TID) | ORAL | 5 refills | Status: DC

## 2018-10-18 MED ORDER — MONTELUKAST SODIUM 10 MG PO TABS: 10.0000 mg | ORAL_TABLET | Freq: Every day | ORAL | 3 refills | Status: DC

## 2018-10-18 MED ORDER — ATORVASTATIN CALCIUM 20 MG PO TABS: 20.0000 mg | ORAL_TABLET | Freq: Every day | ORAL | 3 refills | Status: DC

## 2018-10-18 MED FILL — MONTELUKAST SOD 10 MG TAB: 10 | 90 days supply | Qty: 90 | Fill #0

## 2018-10-18 MED FILL — DICYCLOMINE 10 MG CAPSULE: 10 | 30 days supply | Qty: 120 | Fill #0

## 2018-10-18 MED FILL — CITALOPRAM HBR 20 MG TABLET: 20 | 90 days supply | Qty: 90 | Fill #0

## 2018-10-18 NOTE — Patient Instructions (Signed)
Please take all new medication as prescribed - the celexa 20 mg per day  Please continue all other medications as before, and refills have been done if requested.  Please have the pharmacy call with any other refills you may need.  Please continue your efforts at being more active, low cholesterol diet, and weight control.  You are otherwise up to date with prevention measures today.  Please keep your appointments with your specialists as you may have planned  Please go to the LAB in the Basement (turn left off the elevator) for the tests to be done today  You will be contacted by phone if any changes need to be made immediately.  Otherwise, you will receive a letter about your results with an explanation, but please check with MyChart first.  Please remember to sign up for MyChart if you have not done so, as this will be important to you in the future with finding out test results, communicating by private email, and scheduling acute appointments online when needed.  Please return in 1 year for your yearly visit, or sooner if needed, with Lab testing done 3-5 days before  

## 2018-10-18 NOTE — Assessment & Plan Note (Signed)
Tolerating statin well, for f/u with labs, goal ldl < 100, cont low chol diet

## 2018-10-18 NOTE — Assessment & Plan Note (Signed)
Plans to f/u with urology 

## 2018-10-18 NOTE — Assessment & Plan Note (Addendum)
Mild to mod, for celexa 20 qd,  to f/u any worsening symptoms or concerns, declines psychiatry referral or counseling  In addition to the time spent performing CPE, I spent an additional 15 minutes face to face,in which greater than 50% of this time was spent in counseling and coordination of care for patient's illness as documented, including the differential dx, treatment, further evaluation and other management of depression, HLD and low testosterone

## 2018-10-18 NOTE — Assessment & Plan Note (Signed)

## 2018-10-18 NOTE — Progress Notes (Signed)
Subjective:    Patient ID: Donald Swanson, male    DOB: [birth year], 38 y.o.   MRN: [id number]  HPI  Here for wellness and f/u;  Overall doing ok;  Pt denies Chest pain, worsening SOB, DOE, wheezing, orthopnea, PND, worsening LE edema, palpitations, dizziness or syncope.  Pt denies neurological change such as new headache, facial or extremity weakness.  Pt denies polydipsia, polyuria, or low sugar symptoms. Pt states overall good compliance with treatment and medications, good tolerability, and has been trying to follow appropriate diet.   No fever, night sweats, wt loss, loss of appetite, or other constitutional symptoms.  Pt states good ability with ADL's, has low fall risk, home safety reviewed and adequate, no other significant changes in hearing or vision, and only occasionally active with exercise.   Does have worsening fatigue and low mood more days than not in the last few months, no SI or HI but likely related to difficutly with fiancee relationship.  Mild to mod, nothing seems to make better or worse.    Sees Dr Herrick/urology for low testosterone, thinking of stopping as he just doesn't have the boost in stamina that seemed to be there to start Past Medical History:  Diagnosis Date  . Hyperlipemia   . Low testosterone    Past Surgical History:  Procedure Laterality Date  . KNEE ARTHROSCOPY WITH MEDIAL MENISECTOMY Right 06/10/2015   Procedure: knee arthroscopy with medial menisectomy  ;  Surgeon: Gean BirchwoodFrank Rowan, MD;  Location: [facility];  Service: Orthopedics;  Laterality: Right;    reports that he has quit smoking. His smoking use included cigarettes. He has a 2.25 pack-year smoking history. He has never used smokeless tobacco. He reports current alcohol use. He reports that he does not use drugs. family history includes COPD in his mother. No Known Allergies Current Outpatient Medications on File Prior to Visit  Medication Sig Dispense Refill  . Testosterone  (ANDROGEL PUMP) 20.25 MG/ACT (1.62%) GEL 1 pump each arm daily 75 g 5   No current facility-administered medications on file prior to visit.    Review of Systems Constitutional: Negative for other unusual diaphoresis, sweats, appetite or weight changes HENT: Negative for other worsening hearing loss, ear pain, facial swelling, mouth sores or neck stiffness.   Eyes: Negative for other worsening pain, redness or other visual disturbance.  Respiratory: Negative for other stridor or swelling Cardiovascular: Negative for other palpitations or other chest pain  Gastrointestinal: Negative for worsening diarrhea or loose stools, blood in stool, distention or other pain Genitourinary: Negative for hematuria, flank pain or other change in urine volume.  Musculoskeletal: Negative for myalgias or other joint swelling.  Skin: Negative for other color change, or other wound or worsening drainage.  Neurological: Negative for other syncope or numbness. Hematological: Negative for other adenopathy or swelling Psychiatric/Behavioral: Negative for hallucinations, other worsening agitation, SI, self-injury, or new decreased concentration All other system neg per pt    Objective:   Physical Exam BP 102/70   Pulse (!) 53   Temp 97.9 F (36.6 C) (Oral)   Ht 5\' 4"  (1.626 m)   Wt 181 lb (82.1 kg)   SpO2 98%   BMI 31.07 kg/m  VS noted,  Constitutional: Pt is oriented to person, place, and time. Appears well-developed and well-nourished, in no significant distress and comfortable Head: Normocephalic and atraumatic  Eyes: Conjunctivae and EOM are normal. Pupils are equal, round, and reactive to light Right Ear: External ear normal without  discharge Left Ear: External ear normal without discharge Nose: Nose without discharge or deformity Mouth/Throat: Oropharynx is without other ulcerations and moist  Neck: Normal range of motion. Neck supple. No JVD present. No tracheal deviation present or significant  neck LA or mass Cardiovascular: Normal rate, regular rhythm, normal heart sounds and intact distal pulses.   Pulmonary/Chest: WOB normal and breath sounds without rales or wheezing  Abdominal: Soft. Bowel sounds are normal. NT. No HSM  Musculoskeletal: Normal range of motion. Exhibits no edema Lymphadenopathy: Has no other cervical adenopathy.  Neurological: Pt is alert and oriented to person, place, and time. Pt has normal reflexes. No cranial nerve deficit. Motor grossly intact, Gait intact Skin: Skin is warm and dry. No rash noted or new ulcerations Psychiatric:  Has normal mood and affect. Behavior is normal without agitation No other exam findings Lab Results  Component Value Date   WBC 7.5 10/13/2017   HGB 15.6 10/13/2017   HCT 45.4 10/13/2017   PLT 195.0 10/13/2017   GLUCOSE 94 05/19/2016   CHOL 207 (H) 10/13/2017   TRIG 322.0 (H) 10/13/2017   HDL 26.10 (L) 10/13/2017   LDLDIRECT 137.0 10/13/2017   LDLCALC 99 05/19/2016   ALT 47 05/19/2016   AST 26 05/19/2016   NA 141 05/19/2016   K 4.0 05/19/2016   CL 104 05/19/2016   CREATININE 1.13 05/19/2016   BUN 11 05/19/2016   CO2 29 05/19/2016   PSA 0.68 05/19/2016         Assessment & Plan:

## 2018-10-20 ENCOUNTER — Other Ambulatory Visit (INDEPENDENT_AMBULATORY_CARE_PROVIDER_SITE_OTHER): Payer: No Typology Code available for payment source

## 2018-10-20 DIAGNOSIS — Z0001 Encounter for general adult medical examination with abnormal findings: Secondary | ICD-10-CM

## 2018-10-20 LAB — LIPID PANEL
Cholesterol: 174 mg/dL (ref 0–200)
HDL: 34.1 mg/dL — ABNORMAL LOW (ref >39.00)
LDL Cholesterol: 115 mg/dL — ABNORMAL HIGH (ref 0–99)
NonHDL: 139.67
Total CHOL/HDL Ratio: 5
Triglycerides: 121 mg/dL (ref 0.0–149.0)
VLDL: 24.2 mg/dL (ref 0.0–40.0)

## 2018-10-20 LAB — URINALYSIS, ROUTINE W REFLEX MICROSCOPIC
BILIRUBIN URINE: NEGATIVE
Hgb urine dipstick: NEGATIVE
Ketones, ur: NEGATIVE
Leukocytes, UA: NEGATIVE
NITRITE: NEGATIVE
RBC / HPF: NONE SEEN (ref 0–?)
Specific Gravity, Urine: <=1.005 — AB (ref 1.000–1.030)
Total Protein, Urine: NEGATIVE
Urine Glucose: NEGATIVE
Urobilinogen, UA: 0.2 (ref 0.0–1.0)
pH: 6.5 (ref 5.0–8.0)

## 2018-10-20 LAB — BASIC METABOLIC PANEL
BUN: 16 mg/dL (ref 6–23)
CO2: 26 meq/L (ref 19–32)
Calcium: 9.8 mg/dL (ref 8.4–10.5)
Chloride: 104 mEq/L (ref 96–112)
Creatinine, Ser: 1.15 mg/dL (ref 0.40–1.50)
GFR: 76.79 mL/min (ref >60.00)
Glucose, Bld: 88 mg/dL (ref 70–99)
Potassium: 3.9 mEq/L (ref 3.5–5.1)
Sodium: 139 mEq/L (ref 135–145)

## 2018-10-20 LAB — CBC WITH DIFFERENTIAL/PLATELET
BASOS ABS: 0 10*3/uL (ref 0.0–0.1)
Basophils Relative: 0.7 % (ref 0.0–3.0)
EOS ABS: 0.2 10*3/uL (ref 0.0–0.7)
Eosinophils Relative: 2.6 % (ref 0.0–5.0)
HCT: 49.1 % (ref 39.0–52.0)
Hemoglobin: 17.3 g/dL — ABNORMAL HIGH (ref 13.0–17.0)
Lymphocytes Relative: 36.1 % (ref 12.0–46.0)
Lymphs Abs: 2.2 10*3/uL (ref 0.7–4.0)
MCHC: 35.3 g/dL (ref 30.0–36.0)
MCV: 90.8 fl (ref 78.0–100.0)
Monocytes Absolute: 0.4 10*3/uL (ref 0.1–1.0)
Monocytes Relative: 6.5 % (ref 3.0–12.0)
Neutro Abs: 3.3 10*3/uL (ref 1.4–7.7)
Neutrophils Relative %: 54.1 % (ref 43.0–77.0)
Platelets: 201 10*3/uL (ref 150.0–400.0)
RBC: 5.41 Mil/uL (ref 4.22–5.81)
RDW: 13.5 % (ref 11.5–15.5)
WBC: 6.1 10*3/uL (ref 4.0–10.5)

## 2018-10-20 LAB — HEPATIC FUNCTION PANEL
ALT: 68 U/L — ABNORMAL HIGH (ref 0–53)
AST: 30 U/L (ref 0–37)
Albumin: 4.6 g/dL (ref 3.5–5.2)
Alkaline Phosphatase: 47 U/L (ref 39–117)
BILIRUBIN DIRECT: 0.2 mg/dL (ref 0.0–0.3)
Total Bilirubin: 1.1 mg/dL (ref 0.2–1.2)
Total Protein: 7.4 g/dL (ref 6.0–8.3)

## 2018-10-20 LAB — TSH: TSH: 1.47 u[IU]/mL (ref 0.35–4.50)

## 2018-11-03 MED FILL — TESTOSTERONE 20.25 MG/ACT (: 20.25 MG/AC | 20 days supply | Qty: 75 | Fill #4

## 2018-11-30 MED FILL — TESTOSTERONE 20.25 MG/ACT (: 20.25 MG/AC | 20 days supply | Qty: 75 | Fill #5

## 2018-12-27 MED FILL — TESTOSTERONE 20.25 MG/ACT (: 20.25 MG/AC | 20 days supply | Qty: 75 | Fill #6

## 2019-01-04 MED FILL — ATORVASTATIN 20 MG TABLET: 20 | 90 days supply | Qty: 90 | Fill #0

## 2019-01-30 MED FILL — BD 3 ML SYRINGE WITH NEEDLE: 22G X 1-1/2 | 100 days supply | Qty: 100 | Fill #0

## 2019-02-02 MED FILL — TESTOSTERONE CYP 200 MG/ML: 200 | 28 days supply | Qty: 4 | Fill #0

## 2019-02-16 MED FILL — MONTELUKAST SOD 10 MG TAB: 10 | 90 days supply | Qty: 90 | Fill #1

## 2019-03-27 MED FILL — TESTOSTERONE CYPIONATE 200: 200 | 84 days supply | Qty: 6 | Fill #1

## 2019-03-27 MED FILL — ATORVASTATIN 20 MG TABLET: 20 | 90 days supply | Qty: 90 | Fill #1

## 2019-05-12 MED FILL — MONTELUKAST SOD 10 MG TAB: 10 | 90 days supply | Qty: 90 | Fill #2

## 2019-06-22 MED FILL — TESTOSTERONE CYP 200 MG/ML: 200 | 84 days supply | Qty: 6 | Fill #2

## 2019-08-08 MED FILL — ATORVASTATIN 20 MG TABLET: 20 | 90 days supply | Qty: 90 | Fill #2

## 2019-08-08 MED FILL — TESTOSTERON CYP 1,000 MG/10: 100 | 28 days supply | Qty: 10 | Fill #0

## 2019-12-06 ENCOUNTER — Ambulatory Visit (INDEPENDENT_AMBULATORY_CARE_PROVIDER_SITE_OTHER): Payer: No Typology Code available for payment source | Admitting: Internal Medicine

## 2019-12-06 ENCOUNTER — Encounter: Payer: Self-pay | Admitting: Internal Medicine

## 2019-12-06 ENCOUNTER — Other Ambulatory Visit: Payer: Self-pay

## 2019-12-06 VITALS — BP 124/80 | HR 50 | Temp 97.6°F | Ht 64.0 in | Wt 183.8 lb

## 2019-12-06 DIAGNOSIS — Z0000 Encounter for general adult medical examination without abnormal findings: Secondary | ICD-10-CM

## 2019-12-06 DIAGNOSIS — E559 Vitamin D deficiency, unspecified: Secondary | ICD-10-CM

## 2019-12-06 DIAGNOSIS — E538 Deficiency of other specified B group vitamins: Secondary | ICD-10-CM | POA: Diagnosis not present

## 2019-12-06 LAB — LIPID PANEL
Cholesterol: 215 mg/dL — ABNORMAL HIGH (ref 0–200)
HDL: 30.3 mg/dL — ABNORMAL LOW (ref >39.00)
LDL Cholesterol: 146 mg/dL — ABNORMAL HIGH (ref 0–99)
NonHDL: 184.83
Total CHOL/HDL Ratio: 7
Triglycerides: 194 mg/dL — ABNORMAL HIGH (ref 0.0–149.0)
VLDL: 38.8 mg/dL (ref 0.0–40.0)

## 2019-12-06 LAB — URINALYSIS, ROUTINE W REFLEX MICROSCOPIC
Bilirubin Urine: NEGATIVE
Hgb urine dipstick: NEGATIVE
Ketones, ur: NEGATIVE
Leukocytes,Ua: NEGATIVE
Nitrite: NEGATIVE
RBC / HPF: NONE SEEN (ref 0–?)
Specific Gravity, Urine: 1.015 (ref 1.000–1.030)
Total Protein, Urine: NEGATIVE
Urine Glucose: NEGATIVE
Urobilinogen, UA: 0.2 (ref 0.0–1.0)
pH: 6.5 (ref 5.0–8.0)

## 2019-12-06 LAB — HEPATIC FUNCTION PANEL
ALT: 50 U/L (ref 0–53)
AST: 26 U/L (ref 0–37)
Albumin: 4.5 g/dL (ref 3.5–5.2)
Alkaline Phosphatase: 49 U/L (ref 39–117)
Bilirubin, Direct: 0.1 mg/dL (ref 0.0–0.3)
Total Bilirubin: 1.1 mg/dL (ref 0.2–1.2)
Total Protein: 7.6 g/dL (ref 6.0–8.3)

## 2019-12-06 LAB — CBC WITH DIFFERENTIAL/PLATELET
Basophils Absolute: 0 10*3/uL (ref 0.0–0.1)
Basophils Relative: 0.7 % (ref 0.0–3.0)
Eosinophils Absolute: 0.3 10*3/uL (ref 0.0–0.7)
Eosinophils Relative: 4.2 % (ref 0.0–5.0)
HCT: 49.3 % (ref 39.0–52.0)
Hemoglobin: 17 g/dL (ref 13.0–17.0)
Lymphocytes Relative: 42.4 % (ref 12.0–46.0)
Lymphs Abs: 2.6 10*3/uL (ref 0.7–4.0)
MCHC: 34.4 g/dL (ref 30.0–36.0)
MCV: 91.9 fl (ref 78.0–100.0)
Monocytes Absolute: 0.4 10*3/uL (ref 0.1–1.0)
Monocytes Relative: 6.3 % (ref 3.0–12.0)
Neutro Abs: 2.8 10*3/uL (ref 1.4–7.7)
Neutrophils Relative %: 46.4 % (ref 43.0–77.0)
Platelets: 203 10*3/uL (ref 150.0–400.0)
RBC: 5.36 Mil/uL (ref 4.22–5.81)
RDW: 13.4 % (ref 11.5–15.5)
WBC: 6.1 10*3/uL (ref 4.0–10.5)

## 2019-12-06 LAB — BASIC METABOLIC PANEL
BUN: 15 mg/dL (ref 6–23)
CO2: 25 mEq/L (ref 19–32)
Calcium: 9.8 mg/dL (ref 8.4–10.5)
Chloride: 105 mEq/L (ref 96–112)
Creatinine, Ser: 1 mg/dL (ref 0.40–1.50)
GFR: 84.35 mL/min (ref >60.00)
Glucose, Bld: 88 mg/dL (ref 70–99)
Potassium: 3.9 mEq/L (ref 3.5–5.1)
Sodium: 138 mEq/L (ref 135–145)

## 2019-12-06 LAB — TSH: TSH: 2.08 u[IU]/mL (ref 0.35–4.50)

## 2019-12-06 LAB — VITAMIN B12: Vitamin B-12: 344 pg/mL (ref 211–911)

## 2019-12-06 LAB — VITAMIN D 25 HYDROXY (VIT D DEFICIENCY, FRACTURES): VITD: 13.09 ng/mL — ABNORMAL LOW (ref 30.00–100.00)

## 2019-12-06 MED ORDER — MONTELUKAST SODIUM 10 MG PO TABS: 10.0000 mg | ORAL_TABLET | Freq: Every day | ORAL | 3 refills | Status: DC

## 2019-12-06 MED FILL — MONTELUKAST SOD 10 MG TAB: 10 | 90 days supply | Qty: 90 | Fill #0

## 2019-12-06 NOTE — Assessment & Plan Note (Signed)

## 2019-12-06 NOTE — Progress Notes (Signed)
Subjective:    Patient ID: PHU RECORD, male    DOB: 02/03/1981, 39 y.o.   MRN: [id number]  HPI  Here for wellness and f/u;  Overall doing ok;  Pt denies Chest pain, worsening SOB, DOE, wheezing, orthopnea, PND, worsening LE edema, palpitations, dizziness or syncope.  Pt denies neurological change such as new headache, facial or extremity weakness.  Pt denies polydipsia, polyuria, or low sugar symptoms. Pt states overall good compliance with treatment and medications, good tolerability, and has been trying to follow appropriate diet.  Pt denies worsening depressive symptoms, suicidal ideation or panic. No fever, night sweats, wt loss, loss of appetite, or other constitutional symptoms.  Pt states good ability with ADL's, has low fall risk, home safety reviewed and adequate, no other significant changes in hearing or vision, and only occasionally active with exercise. Plans to try to get to 170 lbs. No new compaints. Wary of the covid vaccine.  Followed closely per urology for low testosterone with polycythemia Past Medical History:  Diagnosis Date  . Hyperlipemia   . Low testosterone    Past Surgical History:  Procedure Laterality Date  . KNEE ARTHROSCOPY WITH MEDIAL MENISECTOMY Right 06/10/2015   Procedure: knee arthroscopy with medial menisectomy  ;  Surgeon: Frederik Pear, MD;  Location: [place];  Service: Orthopedics;  Laterality: Right;    reports that he has quit smoking. His smoking use included cigarettes. He has a 2.25 pack-year smoking history. He has never used smokeless tobacco. He reports current alcohol use. He reports that he does not use drugs. family history includes COPD in his mother. No Known Allergies Current Outpatient Medications on File Prior to Visit  Medication Sig Dispense Refill  . testosterone cypionate (DEPOTESTOSTERONE CYPIONATE) 200 MG/ML injection Inject into the muscle once a week.     No current facility-administered medications on  file prior to visit.   Review of Systems All otherwise neg per pt     Objective:   Physical Exam BP 124/80 (BP Location: Left Arm, Patient Position: Sitting, Cuff Size: Large)   Pulse (!) 50   Temp 97.6 F (36.4 C) (Oral)   Ht 5\' 4"  (1.626 m)   Wt 183 lb 12.8 oz (83.4 kg)   SpO2 98%   BMI 31.55 kg/m  VS noted,  Constitutional: Pt appears in NAD HENT: Head: NCAT.  Right Ear: External ear normal.  Left Ear: External ear normal.  Eyes: . Pupils are equal, round, and reactive to light. Conjunctivae and EOM are normal Nose: without d/c or deformity Neck: Neck supple. Gross normal ROM Cardiovascular: Normal rate and regular rhythm.   Pulmonary/Chest: Effort normal and breath sounds without rales or wheezing.  Abd:  Soft, NT, ND, + BS, no organomegaly Neurological: Pt is alert. At baseline orientation, motor grossly intact Skin: Skin is warm. No rashes, other new lesions, no LE edema Psychiatric: Pt behavior is normal without agitation  All otherwise neg per pt Lab Results  Component Value Date   WBC 6.1 10/20/2018   HGB 17.3 (H) 10/20/2018   HCT 49.1 10/20/2018   PLT 201.0 10/20/2018   GLUCOSE 88 10/20/2018   CHOL 174 10/20/2018   TRIG 121.0 10/20/2018   HDL 34.10 (L) 10/20/2018   LDLDIRECT 137.0 10/13/2017   LDLCALC 115 (H) 10/20/2018   ALT 68 (H) 10/20/2018   AST 30 10/20/2018   NA 139 10/20/2018   K 3.9 10/20/2018   CL 104 10/20/2018   CREATININE 1.15 10/20/2018  BUN 16 10/20/2018   CO2 26 10/20/2018   TSH 1.47 10/20/2018   PSA 0.68 05/19/2016          Assessment & Plan:

## 2019-12-06 NOTE — Patient Instructions (Signed)

## 2020-02-13 ENCOUNTER — Other Ambulatory Visit: Payer: Self-pay | Admitting: Internal Medicine

## 2020-02-13 MED FILL — DICYCLOMINE 10 MG CAPSULE: 10 | 30 days supply | Qty: 120 | Fill #0

## 2020-04-15 ENCOUNTER — Encounter: Payer: Self-pay | Admitting: Internal Medicine

## 2020-04-17 ENCOUNTER — Telehealth: Payer: Self-pay | Admitting: Internal Medicine

## 2020-04-17 DIAGNOSIS — J3489 Other specified disorders of nose and nasal sinuses: Secondary | ICD-10-CM

## 2020-04-17 NOTE — Telephone Encounter (Signed)
Ok this is done 

## 2020-04-17 NOTE — Telephone Encounter (Signed)
New message:    Pt is calling and states he would like a referral to the ENT. Pt states they have discussed this issue before but he hasn't made the appt. Please advise on whether the pt needs an appt before the referral can be sent.

## 2020-04-17 NOTE — Telephone Encounter (Signed)
Sent to Dr. John. 

## 2020-05-06 MED FILL — TESTOSTERON CYP 1,000 MG/10: 100 | 28 days supply | Qty: 10 | Fill #0

## 2020-05-14 ENCOUNTER — Ambulatory Visit (INDEPENDENT_AMBULATORY_CARE_PROVIDER_SITE_OTHER): Payer: No Typology Code available for payment source | Admitting: Otolaryngology

## 2020-05-14 ENCOUNTER — Encounter (INDEPENDENT_AMBULATORY_CARE_PROVIDER_SITE_OTHER): Payer: Self-pay | Admitting: Otolaryngology

## 2020-05-14 ENCOUNTER — Other Ambulatory Visit: Payer: Self-pay

## 2020-05-14 VITALS — Temp 97.3°F

## 2020-05-14 DIAGNOSIS — J342 Deviated nasal septum: Secondary | ICD-10-CM | POA: Diagnosis not present

## 2020-05-14 DIAGNOSIS — J329 Chronic sinusitis, unspecified: Secondary | ICD-10-CM

## 2020-05-14 NOTE — Progress Notes (Signed)
HPI: Donald Swanson is a 39 y.o. male who presents is referred by Dr. Jonny Ruiz for evaluation of chronic nasal sinus issues that he has had for years.  He has always had trouble breathing through his nose especially on the left side.  He has had history of frequent sinus infections in the past although he is having no symptoms of sinus infection presently.  He has had problems with yellow-green discharge when he has sinus infection as well as pain in his teeth.  He is also had headaches and pressure in the head. He is presently having no mucopurulent discharge from his nose but has chronic trouble breathing through his nose especially on the left side..  Past Medical History:  Diagnosis Date  . Hyperlipemia   . Low testosterone    Past Surgical History:  Procedure Laterality Date  . KNEE ARTHROSCOPY WITH MEDIAL MENISECTOMY Right 06/10/2015   Procedure: knee arthroscopy with medial menisectomy  ;  Surgeon: Gean Birchwood, MD;  Location: [facility];  Service: Orthopedics;  Laterality: Right;   Social History   Socioeconomic History  . Marital status: Single    Spouse name: Not on file  . Number of children: 1  . Years of education: 61  . Highest education level: Not on file  Occupational History  . Occupation: Curator  Tobacco Use  . Smoking status: Former Smoker    Packs/day: 0.25    Years: 9.00    Pack years: 2.25    Types: Cigarettes  . Smokeless tobacco: Never Used  Substance and Sexual Activity  . Alcohol use: Yes    Comment: occas  . Drug use: No  . Sexual activity: Not on file  Other Topics Concern  . Not on file  Social History Narrative   Fun: Work on cars, guns, build   Social Determinants of Corporate investment banker Strain:   . Difficulty of Paying Living Expenses:   Food Insecurity:   . Worried About Programme researcher, broadcasting/film/video in the Last Year:   . Barista in the Last Year:   Transportation Needs:   . Freight forwarder (Medical):   Marland Kitchen  Lack of Transportation (Non-Medical):   Physical Activity:   . Days of Exercise per Week:   . Minutes of Exercise per Session:   Stress:   . Feeling of Stress :   Social Connections:   . Frequency of Communication with Friends and Family:   . Frequency of Social Gatherings with Friends and Family:   . Attends Religious Services:   . Active Member of Clubs or Organizations:   . Attends Banker Meetings:   Marland Kitchen Marital Status:    Family History  Problem Relation Age of Onset  . COPD Mother    No Known Allergies Prior to Admission medications   Medication Sig Start Date End Date Taking? Authorizing Provider  dicyclomine (BENTYL) 10 MG capsule TAKE 1 CAPSULE BY MOUTH 4 TIMES DAILY - BEFORE MEALS AND AT BEDTIME. 02/13/20  Yes Corwin Levins, MD  montelukast (SINGULAIR) 10 MG tablet Take 1 tablet (10 mg total) by mouth daily. 12/06/19  Yes Corwin Levins, MD  testosterone cypionate (DEPOTESTOSTERONE CYPIONATE) 200 MG/ML injection Inject into the muscle once a week.   Yes [provider]     Positive ROS: Otherwise negative  All other systems have been reviewed and were otherwise negative with the exception of those mentioned in the HPI and as above.  Physical Exam: Constitutional: Alert, well-appearing, no acute distress Ears: External ears without lesions or tenderness. Ear canals are clear bilaterally with intact, clear TMs.  Nasal: External nose without lesions. Septum is severely deviated anteriorly to the left with the caudal edge of the cartilaginous septum bowing into the right nostril..  I could barely visualize the middle meatus regions which were clear bilaterally with no obvious mucopurulent discharge noted.  No polyps. Oral: Lips and gums without lesions. Tongue and palate mucosa without lesions. Posterior oropharynx clear. Neck: No palpable adenopathy or masses. Respiratory: Breathing comfortably  Skin: No facial/neck lesions or rash  noted.  Procedures  Assessment: Severe deviation of the nasal septum mostly cartilaginous. History of recurrent sinus infections.  Plan: We will plan on scheduling a CT scan to better evaluate the sinuses prior to scheduling surgery.  He will follow-up following the CT scan to review this and plan surgical intervention toward the end of August or 1st of September.   Narda Bonds, MD   CC:

## 2020-05-15 ENCOUNTER — Other Ambulatory Visit (INDEPENDENT_AMBULATORY_CARE_PROVIDER_SITE_OTHER): Payer: Self-pay

## 2020-05-15 DIAGNOSIS — J329 Chronic sinusitis, unspecified: Secondary | ICD-10-CM

## 2020-05-28 ENCOUNTER — Other Ambulatory Visit: Payer: Self-pay

## 2020-05-28 ENCOUNTER — Ambulatory Visit
Admission: RE | Admit: 2020-05-28 | Discharge: 2020-05-28 | Disposition: A | Discharge status: Home / Self Care | Payer: No Typology Code available for payment source | Source: Ambulatory Visit | Attending: Otolaryngology | Admitting: Otolaryngology

## 2020-05-28 DIAGNOSIS — J329 Chronic sinusitis, unspecified: Secondary | ICD-10-CM

## 2020-06-03 ENCOUNTER — Encounter (INDEPENDENT_AMBULATORY_CARE_PROVIDER_SITE_OTHER): Payer: Self-pay | Admitting: Otolaryngology

## 2020-06-03 ENCOUNTER — Other Ambulatory Visit: Payer: Self-pay

## 2020-06-03 ENCOUNTER — Ambulatory Visit (INDEPENDENT_AMBULATORY_CARE_PROVIDER_SITE_OTHER): Payer: No Typology Code available for payment source | Admitting: Otolaryngology

## 2020-06-03 VITALS — Temp 97.3°F

## 2020-06-03 DIAGNOSIS — J343 Hypertrophy of nasal turbinates: Secondary | ICD-10-CM | POA: Diagnosis not present

## 2020-06-03 DIAGNOSIS — J322 Chronic ethmoidal sinusitis: Secondary | ICD-10-CM

## 2020-06-03 DIAGNOSIS — J321 Chronic frontal sinusitis: Secondary | ICD-10-CM

## 2020-06-03 DIAGNOSIS — J342 Deviated nasal septum: Secondary | ICD-10-CM | POA: Diagnosis not present

## 2020-06-03 NOTE — Progress Notes (Signed)
HPI: Donald Swanson is a 39 y.o. male who returns today for evaluation of chronic nasal sinus symptoms.  He has always had trouble breathing through the left side of his nose and frequently gets headaches above his eyebrows and between the eyes.  He presents today to review his CT scan. On review of the CT scan this shows a severe septal deviation as noted on rhinoscopy.  He also has moderate frontal sinus disease and ethmoid disease.  The sphenoid sinuses are clear bilaterally.Marland Kitchen He takes a testosterone injection as well as occasionally takes a statin medication. Denies any history of cardiac disease, hypertension or diabetes.  Past Medical History:  Diagnosis Date   Hyperlipemia    Low testosterone    Past Surgical History:  Procedure Laterality Date   KNEE ARTHROSCOPY WITH MEDIAL MENISECTOMY Right 06/10/2015   Procedure: knee arthroscopy with medial menisectomy  ;  Surgeon: Gean Birchwood, MD;  Location: [facility];  Service: Orthopedics;  Laterality: Right;   Social History   Socioeconomic History   Marital status: Married    Spouse name: Not on file   Number of children: 1   Years of education: 14   Highest education level: Not on file  Occupational History   Occupation: Curator  Tobacco Use   Smoking status: Former Smoker    Packs/day: 0.25    Years: 9.00    Pack years: 2.25    Types: Cigarettes   Smokeless tobacco: Never Used  Substance and Sexual Activity   Alcohol use: Yes    Comment: occas   Drug use: No   Sexual activity: Not on file  Other Topics Concern   Not on file  Social History Narrative   Fun: Work on cars, guns, build   Social Determinants of Corporate investment banker Strain:    Difficulty of Paying Living Expenses: Not on file  Food Insecurity:    Worried About Programme researcher, broadcasting/film/video in the Last Year: Not on file   [employer] of Food in the Last Year: Not on file  Transportation Needs:    Lack of Transportation  (Medical): Not on file   Lack of Transportation (Non-Medical): Not on file  Physical Activity:    Days of Exercise per Week: Not on file   Minutes of Exercise per Session: Not on file  Stress:    Feeling of Stress : Not on file  Social Connections:    Frequency of Communication with Friends and Family: Not on file   Frequency of Social Gatherings with Friends and Family: Not on file   Attends Religious Services: Not on file   Active Member of Clubs or Organizations: Not on file   Attends Banker Meetings: Not on file   Marital Status: Not on file   Family History  Problem Relation Age of Onset   COPD Mother    No Known Allergies Prior to Admission medications   Medication Sig Start Date End Date Taking? Authorizing Provider  dicyclomine (BENTYL) 10 MG capsule TAKE 1 CAPSULE BY MOUTH 4 TIMES DAILY - BEFORE MEALS AND AT BEDTIME. 02/13/20  Yes Corwin Levins, MD  montelukast (SINGULAIR) 10 MG tablet Take 1 tablet (10 mg total) by mouth daily. 12/06/19  Yes Corwin Levins, MD  testosterone cypionate (DEPOTESTOSTERONE CYPIONATE) 200 MG/ML injection Inject into the muscle once a week.   Yes [provider]     Positive ROS: Otherwise negative  All other systems have been reviewed  and were otherwise negative with the exception of those mentioned in the HPI and as above.  Physical Exam: Constitutional: Alert, well-appearing, no acute distress Ears: External ears without lesions or tenderness. Ear canals are clear bilaterally with intact, clear TMs.  Nasal: External nose without lesions. Septum severely deviated to the left..  No polyps noted.  Middle meatus regions were clear on exam today. Oral: Lips and gums without lesions. Tongue and palate mucosa without lesions. Posterior oropharynx clear. Neck: No palpable adenopathy or masses Respiratory: Breathing comfortably  Skin: No facial/neck lesions or rash noted.  Procedures  Assessment: Severe septal  deviation to the left with turbinate hypertrophy Chronic sinus disease predominately within the ethmoid and frontal sinus region.  Plan: Discussed with him concerning septoplasty and turbinate reductions along with FESS with balloon dilation of the nasal frontal ducts.  He will require packing and septal splints placed. We will plan on scheduling this in the next few weeks and.  Reviewed this with him   Narda Bonds, MD

## 2020-06-20 ENCOUNTER — Ambulatory Visit (INDEPENDENT_AMBULATORY_CARE_PROVIDER_SITE_OTHER): Payer: Self-pay | Admitting: Otolaryngology

## 2020-06-20 DIAGNOSIS — J342 Deviated nasal septum: Secondary | ICD-10-CM

## 2020-06-20 NOTE — H&P (View-Only) (Signed)
PREOPERATIVE H&P ° °Chief Complaint: Chronic nasal obstruction and sinus problems with headaches ° °HPI: °Donald Swanson is a 38 y.o. male who presents for evaluation of chronic nasal obstruction.  On exam patient has a severe left septal deviation.  He also underwent CT scan of the sinuses that showed maxillary ethmoid and frontal sinus disease.  He is taken to the operating room this time for septoplasty, turbinate reductions and functional endoscopic sinus surgery with possible balloon dilation of the frontal sinuses. ° °Past Medical History:  °Diagnosis Date  °• Hyperlipemia   °• Low testosterone   ° °Past Surgical History:  °Procedure Laterality Date  °• KNEE ARTHROSCOPY WITH MEDIAL MENISECTOMY Right 06/10/2015  ° Procedure: knee arthroscopy with medial menisectomy  ;  Surgeon: Frank Rowan, MD;  [facility];  Service: Orthopedics;  Laterality: Right;  ° °Social History  ° °Socioeconomic History  °• Marital status: Married  °  Spouse name: Not on file  °• Number of children: 1  °• Years of education: 14  °• Highest education level: Not on file  °Occupational History  °• Occupation: Mechanic  °Tobacco Use  °• Smoking status: Former Smoker  °  Packs/day: 0.25  °  Years: 9.00  °  Pack years: 2.25  °  Types: Cigarettes  °• Smokeless tobacco: Never Used  °Substance and Sexual Activity  °• Alcohol use: Yes  °  Comment: occas  °• Drug use: No  °• Sexual activity: Not on file  °Other Topics Concern  °• Not on file  °Social History Narrative  ° Fun: Work on cars, guns, build  ° °Social Determinants of Health  ° °Financial Resource Strain:   °• Difficulty of Paying Living Expenses: Not on file  °Food Insecurity:   °• Worried About Running Out of Food in the Last Year: Not on file  °• Ran Out of Food in the Last Year: Not on file  °Transportation Needs:   °• Lack of Transportation (Medical): Not on file  °• Lack of Transportation (Non-Medical): Not on file  °Physical Activity:   °• Days of  Exercise per Week: Not on file  °• Minutes of Exercise per Session: Not on file  °Stress:   °• Feeling of Stress : Not on file  °Social Connections:   °• Frequency of Communication with Friends and Family: Not on file  °• Frequency of Social Gatherings with Friends and Family: Not on file  °• Attends Religious Services: Not on file  °• Active Member of Clubs or Organizations: Not on file  °• Attends Club or Organization Meetings: Not on file  °• Marital Status: Not on file  ° °Family History  °Problem Relation Age of Onset  °• COPD Mother   ° °No Known Allergies °Prior to Admission medications   °Medication Sig Start Date End Date Taking? Authorizing Provider  °dicyclomine (BENTYL) 10 MG capsule TAKE 1 CAPSULE BY MOUTH 4 TIMES DAILY - BEFORE MEALS AND AT BEDTIME. 02/13/20   John, James W, MD  °montelukast (SINGULAIR) 10 MG tablet Take 1 tablet (10 mg total) by mouth daily. 12/06/19   John, James W, MD  °testosterone cypionate (DEPOTESTOSTERONE CYPIONATE) 200 MG/ML injection Inject into the muscle once a week.    [provider]  ° ° ° °Positive ROS: Otherwise negative ° °All other systems have been reviewed and were otherwise negative with the exception of those mentioned in the HPI and as above. ° °Physical Exam: °There were no vitals filed   for this visit. ° °General: Alert, no acute distress °Oral: Normal oral mucosa and tonsils °Nasal: Intranasal exam reveals a severe septal deviation to the left with moderate mucosal swelling and edema of the middle meatus. °Neck: No palpable adenopathy or thyroid nodules °Ear: Ear canal is clear with normal appearing TMs °Cardiovascular: Regular rate and rhythm, no murmur.  °Respiratory: Clear to auscultation °Neurologic: Alert and oriented x 3 ° ° °Assessment/Plan: °Septal deviation with a turban hypertrophy.  Chronic sinus disease involving the frontal ethmoid and maxillary sinuses ° °Plan for septoplasty, turbinate reductions and functional endoscopic sinus surgery  with possible balloon sinuplasty of the frontal sinuses. ° ° °Soleia Badolato, MD °06/20/2020 °10:44 AM ° °

## 2020-06-20 NOTE — H&P (Signed)
PREOPERATIVE H&P  Chief Complaint: Chronic nasal obstruction and sinus problems with headaches  HPI: Donald Swanson is a 39 y.o. male who presents for evaluation of chronic nasal obstruction.  On exam patient has a severe left septal deviation.  He also underwent CT scan of the sinuses that showed maxillary ethmoid and frontal sinus disease.  He is taken to the operating room this time for septoplasty, turbinate reductions and functional endoscopic sinus surgery with possible balloon dilation of the frontal sinuses.  Past Medical History:  Diagnosis Date   Hyperlipemia    Low testosterone    Past Surgical History:  Procedure Laterality Date   KNEE ARTHROSCOPY WITH MEDIAL MENISECTOMY Right 06/10/2015   Procedure: knee arthroscopy with medial menisectomy  ;  Surgeon: Gean Birchwood, MD;  Location: [facility];  Service: Orthopedics;  Laterality: Right;   Social History   Socioeconomic History   Marital status: Married    Spouse name: Not on file   Number of children: 1   Years of education: 14   Highest education level: Not on file  Occupational History   Occupation: Curator  Tobacco Use   Smoking status: Former Smoker    Packs/day: 0.25    Years: 9.00    Pack years: 2.25    Types: Cigarettes   Smokeless tobacco: Never Used  Substance and Sexual Activity   Alcohol use: Yes    Comment: occas   Drug use: No   Sexual activity: Not on file  Other Topics Concern   Not on file  Social History Narrative   Fun: Work on cars, guns, build   Social Determinants of Corporate investment banker Strain:    Difficulty of Paying Living Expenses: Not on file  Food Insecurity:    Worried About Programme researcher, broadcasting/film/video in the Last Year: Not on file   [employer] of Food in the Last Year: Not on file  Transportation Needs:    Lack of Transportation (Medical): Not on file   Lack of Transportation (Non-Medical): Not on file  Physical Activity:    Days of  Exercise per Week: Not on file   Minutes of Exercise per Session: Not on file  Stress:    Feeling of Stress : Not on file  Social Connections:    Frequency of Communication with Friends and Family: Not on file   Frequency of Social Gatherings with Friends and Family: Not on file   Attends Religious Services: Not on file   Active Member of Clubs or Organizations: Not on file   Attends Banker Meetings: Not on file   Marital Status: Not on file   Family History  Problem Relation Age of Onset   COPD Mother    No Known Allergies Prior to Admission medications   Medication Sig Start Date End Date Taking? Authorizing Provider  dicyclomine (BENTYL) 10 MG capsule TAKE 1 CAPSULE BY MOUTH 4 TIMES DAILY - BEFORE MEALS AND AT BEDTIME. 02/13/20   Corwin Levins, MD  montelukast (SINGULAIR) 10 MG tablet Take 1 tablet (10 mg total) by mouth daily. 12/06/19   Corwin Levins, MD  testosterone cypionate (DEPOTESTOSTERONE CYPIONATE) 200 MG/ML injection Inject into the muscle once a week.    [provider]     Positive ROS: Otherwise negative  All other systems have been reviewed and were otherwise negative with the exception of those mentioned in the HPI and as above.  Physical Exam: There were no vitals filed  for this visit.  General: Alert, no acute distress Oral: Normal oral mucosa and tonsils Nasal: Intranasal exam reveals a severe septal deviation to the left with moderate mucosal swelling and edema of the middle meatus. Neck: No palpable adenopathy or thyroid nodules Ear: Ear canal is clear with normal appearing TMs Cardiovascular: Regular rate and rhythm, no murmur.  Respiratory: Clear to auscultation Neurologic: Alert and oriented x 3   Assessment/Plan: Septal deviation with a turban hypertrophy.  Chronic sinus disease involving the frontal ethmoid and maxillary sinuses  Plan for septoplasty, turbinate reductions and functional endoscopic sinus surgery  with possible balloon sinuplasty of the frontal sinuses.   [name], MD 06/20/2020 10:44 AM

## 2020-07-03 ENCOUNTER — Other Ambulatory Visit: Payer: Self-pay

## 2020-07-03 ENCOUNTER — Encounter (HOSPITAL_BASED_OUTPATIENT_CLINIC_OR_DEPARTMENT_OTHER): Payer: Self-pay | Admitting: Otolaryngology

## 2020-07-05 ENCOUNTER — Other Ambulatory Visit (HOSPITAL_COMMUNITY)
Admission: RE | Admit: 2020-07-05 | Discharge: 2020-07-05 | Disposition: A | Discharge status: Home / Self Care | Payer: No Typology Code available for payment source | Source: Ambulatory Visit | Attending: Otolaryngology | Admitting: Otolaryngology

## 2020-07-05 DIAGNOSIS — Z20822 Contact with and (suspected) exposure to covid-19: Secondary | ICD-10-CM | POA: Insufficient documentation

## 2020-07-05 DIAGNOSIS — Z01812 Encounter for preprocedural laboratory examination: Secondary | ICD-10-CM | POA: Diagnosis present

## 2020-07-05 LAB — SARS CORONAVIRUS 2 (TAT 6-24 HRS): SARS Coronavirus 2: NEGATIVE

## 2020-07-09 ENCOUNTER — Ambulatory Visit (HOSPITAL_BASED_OUTPATIENT_CLINIC_OR_DEPARTMENT_OTHER)
Admission: RE | Admit: 2020-07-09 | Discharge: 2020-07-09 | Disposition: A | Discharge status: Home / Self Care | Payer: No Typology Code available for payment source | Attending: Otolaryngology | Admitting: Otolaryngology

## 2020-07-09 ENCOUNTER — Encounter (HOSPITAL_BASED_OUTPATIENT_CLINIC_OR_DEPARTMENT_OTHER)
Admission: RE | Disposition: A | Discharge status: Home / Self Care | Payer: Self-pay | Source: Home / Self Care | Attending: Otolaryngology

## 2020-07-09 ENCOUNTER — Ambulatory Visit (HOSPITAL_BASED_OUTPATIENT_CLINIC_OR_DEPARTMENT_OTHER): Payer: No Typology Code available for payment source | Admitting: Certified Registered"

## 2020-07-09 ENCOUNTER — Encounter (HOSPITAL_BASED_OUTPATIENT_CLINIC_OR_DEPARTMENT_OTHER): Payer: Self-pay | Admitting: Otolaryngology

## 2020-07-09 DIAGNOSIS — Z87891 Personal history of nicotine dependence: Secondary | ICD-10-CM | POA: Diagnosis not present

## 2020-07-09 DIAGNOSIS — Z825 Family history of asthma and other chronic lower respiratory diseases: Secondary | ICD-10-CM | POA: Diagnosis not present

## 2020-07-09 DIAGNOSIS — J342 Deviated nasal septum: Secondary | ICD-10-CM | POA: Insufficient documentation

## 2020-07-09 DIAGNOSIS — J343 Hypertrophy of nasal turbinates: Secondary | ICD-10-CM | POA: Insufficient documentation

## 2020-07-09 DIAGNOSIS — Z79899 Other long term (current) drug therapy: Secondary | ICD-10-CM | POA: Diagnosis not present

## 2020-07-09 DIAGNOSIS — J324 Chronic pansinusitis: Secondary | ICD-10-CM

## 2020-07-09 DIAGNOSIS — E785 Hyperlipidemia, unspecified: Secondary | ICD-10-CM | POA: Insufficient documentation

## 2020-07-09 DIAGNOSIS — F329 Major depressive disorder, single episode, unspecified: Secondary | ICD-10-CM | POA: Insufficient documentation

## 2020-07-09 DIAGNOSIS — J3489 Other specified disorders of nose and nasal sinuses: Secondary | ICD-10-CM | POA: Insufficient documentation

## 2020-07-09 HISTORY — PX: ETHMOIDECTOMY: SHX5197

## 2020-07-09 HISTORY — PX: MAXILLARY ANTROSTOMY: SHX2003

## 2020-07-09 HISTORY — PX: SINUS ENDO WITH FUSION: SHX5329

## 2020-07-09 HISTORY — PX: NASAL SINUS SURGERY: SHX719

## 2020-07-09 HISTORY — PX: NASAL SEPTOPLASTY W/ TURBINOPLASTY: SHX2070

## 2020-07-09 SURGERY — SEPTOPLASTY, NOSE, WITH NASAL TURBINATE REDUCTION
Anesthesia: General | Site: Nose | Laterality: Bilateral

## 2020-07-09 MED ORDER — ONDANSETRON HCL 4 MG/2ML IJ SOLN
INTRAMUSCULAR | Status: DC | PRN
  Administered 2020-07-09: 4 mg via INTRAVENOUS

## 2020-07-09 MED ORDER — SUGAMMADEX SODIUM 200 MG/2ML IV SOLN
INTRAVENOUS | Status: DC | PRN
  Administered 2020-07-09: 200 mg via INTRAVENOUS

## 2020-07-09 MED ORDER — PROPOFOL 10 MG/ML IV BOLUS
INTRAVENOUS | Status: DC | PRN
  Administered 2020-07-09: 150 mg via INTRAVENOUS

## 2020-07-09 MED ORDER — PROPOFOL 10 MG/ML IV BOLUS
INTRAVENOUS | Status: AC
  Filled 2020-07-09: qty 20

## 2020-07-09 MED ORDER — HYDROMORPHONE HCL 1 MG/ML IJ SOLN: 0.2500 mg | INTRAMUSCULAR | Status: DC | PRN

## 2020-07-09 MED ORDER — ONDANSETRON HCL 4 MG/2ML IJ SOLN
INTRAMUSCULAR | Status: AC
  Filled 2020-07-09: qty 2

## 2020-07-09 MED ORDER — MEPERIDINE HCL 25 MG/ML IJ SOLN: 6.2500 mg | INTRAMUSCULAR | Status: DC | PRN

## 2020-07-09 MED ORDER — EPINEPHRINE PF 1 MG/ML IJ SOLN
INTRAMUSCULAR | Status: DC | PRN
  Administered 2020-07-09: 1 mg

## 2020-07-09 MED ORDER — MIDAZOLAM HCL 2 MG/2ML IJ SOLN
INTRAMUSCULAR | Status: AC
  Filled 2020-07-09: qty 2

## 2020-07-09 MED ORDER — LIDOCAINE-EPINEPHRINE 1 %-1:100000 IJ SOLN
INTRAMUSCULAR | Status: AC
  Filled 2020-07-09: qty 1

## 2020-07-09 MED ORDER — CEFAZOLIN SODIUM-DEXTROSE 2-4 GM/100ML-% IV SOLN
INTRAVENOUS | Status: AC
  Filled 2020-07-09: qty 100

## 2020-07-09 MED ORDER — DEXAMETHASONE SODIUM PHOSPHATE 10 MG/ML IJ SOLN
INTRAMUSCULAR | Status: AC
  Filled 2020-07-09: qty 1

## 2020-07-09 MED ORDER — LIDOCAINE-EPINEPHRINE 1 %-1:100000 IJ SOLN
INTRAMUSCULAR | Status: DC | PRN
  Administered 2020-07-09: 9 mL

## 2020-07-09 MED ORDER — OXYMETAZOLINE HCL 0.05 % NA SOLN
NASAL | Status: DC | PRN
  Administered 2020-07-09: 1 via TOPICAL

## 2020-07-09 MED ORDER — AMISULPRIDE (ANTIEMETIC) 5 MG/2ML IV SOLN: 10.0000 mg | Freq: Once | INTRAVENOUS | Status: DC | PRN

## 2020-07-09 MED ORDER — CEFAZOLIN SODIUM-DEXTROSE 2-4 GM/100ML-% IV SOLN
2.0000 g | INTRAVENOUS | Status: AC
  Administered 2020-07-09: 2 g via INTRAVENOUS

## 2020-07-09 MED ORDER — CHLORHEXIDINE GLUCONATE CLOTH 2 % EX PADS: 6.0000 | MEDICATED_PAD | Freq: Once | CUTANEOUS | Status: DC

## 2020-07-09 MED ORDER — ROCURONIUM BROMIDE 10 MG/ML (PF) SYRINGE
PREFILLED_SYRINGE | INTRAVENOUS | Status: AC
  Filled 2020-07-09: qty 10

## 2020-07-09 MED ORDER — FENTANYL CITRATE (PF) 100 MCG/2ML IJ SOLN
INTRAMUSCULAR | Status: DC | PRN
Start: 2020-07-09 — End: 2020-07-09
  Administered 2020-07-09 (×4): 50 ug via INTRAVENOUS

## 2020-07-09 MED ORDER — PROMETHAZINE HCL 25 MG/ML IJ SOLN: 6.2500 mg | INTRAMUSCULAR | Status: DC | PRN

## 2020-07-09 MED ORDER — METHYLPREDNISOLONE ACETATE 80 MG/ML IJ SUSP
INTRAMUSCULAR | Status: AC
  Filled 2020-07-09: qty 1

## 2020-07-09 MED ORDER — ROCURONIUM BROMIDE 100 MG/10ML IV SOLN
INTRAVENOUS | Status: DC | PRN
  Administered 2020-07-09: 80 mg via INTRAVENOUS
  Administered 2020-07-09 (×2): 10 mg via INTRAVENOUS

## 2020-07-09 MED ORDER — MUPIROCIN 2 % EX OINT
TOPICAL_OINTMENT | CUTANEOUS | Status: DC | PRN
  Administered 2020-07-09: 1 via TOPICAL

## 2020-07-09 MED ORDER — BACITRACIN ZINC 500 UNIT/GM EX OINT
TOPICAL_OINTMENT | CUTANEOUS | Status: AC
  Filled 2020-07-09: qty 28.35

## 2020-07-09 MED ORDER — CEPHALEXIN 500 MG PO CAPS: 500.0000 mg | ORAL_CAPSULE | Freq: Two times a day (BID) | ORAL | 0 refills | Status: DC

## 2020-07-09 MED ORDER — LIDOCAINE 2% (20 MG/ML) 5 ML SYRINGE
INTRAMUSCULAR | Status: AC
  Filled 2020-07-09: qty 5

## 2020-07-09 MED ORDER — OXYCODONE HCL 5 MG/5ML PO SOLN: 5.0000 mg | Freq: Once | ORAL | Status: DC | PRN

## 2020-07-09 MED ORDER — FENTANYL CITRATE (PF) 100 MCG/2ML IJ SOLN
INTRAMUSCULAR | Status: AC
  Filled 2020-07-09: qty 2

## 2020-07-09 MED ORDER — DEXAMETHASONE SODIUM PHOSPHATE 4 MG/ML IJ SOLN
INTRAMUSCULAR | Status: DC | PRN
  Administered 2020-07-09: 10 mg via INTRAVENOUS

## 2020-07-09 MED ORDER — OXYCODONE HCL 5 MG PO TABS: 5.0000 mg | ORAL_TABLET | Freq: Once | ORAL | Status: DC | PRN

## 2020-07-09 MED ORDER — MUPIROCIN 2 % EX OINT
TOPICAL_OINTMENT | CUTANEOUS | Status: AC
  Filled 2020-07-09: qty 22

## 2020-07-09 MED ORDER — OXYMETAZOLINE HCL 0.05 % NA SOLN
NASAL | Status: AC
  Filled 2020-07-09: qty 30

## 2020-07-09 MED ORDER — LACTATED RINGERS IV SOLN: INTRAVENOUS | Status: DC

## 2020-07-09 MED ORDER — EPINEPHRINE PF 1 MG/ML IJ SOLN
INTRAMUSCULAR | Status: AC
  Filled 2020-07-09: qty 2

## 2020-07-09 MED ORDER — MIDAZOLAM HCL 5 MG/5ML IJ SOLN
INTRAMUSCULAR | Status: DC | PRN
  Administered 2020-07-09: 2 mg via INTRAVENOUS

## 2020-07-09 MED ORDER — SODIUM CHLORIDE (PF) 0.9 % IJ SOLN
INTRAMUSCULAR | Status: AC
  Filled 2020-07-09: qty 10

## 2020-07-09 MED ORDER — LIDOCAINE HCL (CARDIAC) PF 100 MG/5ML IV SOSY
PREFILLED_SYRINGE | INTRAVENOUS | Status: DC | PRN
  Administered 2020-07-09: 80 mg via INTRAVENOUS

## 2020-07-09 MED ORDER — HYDROCODONE-ACETAMINOPHEN 5-325 MG PO TABS: 1.0000 | ORAL_TABLET | Freq: Four times a day (QID) | ORAL | 0 refills | Status: AC | PRN

## 2020-07-09 MED ORDER — SODIUM CHLORIDE 0.9 % IV SOLN
INTRAVENOUS | Status: AC | PRN
  Administered 2020-07-09: 100 mL

## 2020-07-09 MED FILL — HYDROCODON-APAP 5-325: 5-325 | 5 days supply | Qty: 14 | Fill #0

## 2020-07-09 MED FILL — CEPHALEXIN 500 MG CAPSULE: 500 | 10 days supply | Qty: 20 | Fill #0

## 2020-07-09 SURGICAL SUPPLY — 77 items
ATTRACTOMAT 16X20 MAGNETIC DRP (DRAPES) IMPLANT
BALLN FRONTAL NUVENT 6X17 ×2 IMPLANT
BALLN SINUPLASTY KIT 6X16 (BALLOONS)
BALLOON FRONTAL NUVENT 6X17 ×1 IMPLANT
BALLOON SINUPLASTY KIT 6X16 (BALLOONS) IMPLANT
BLADE INF TURB ROT M4 2 5PK (BLADE) ×2 IMPLANT
BLADE RAD40 ROTATE 4M 4 5PK (BLADE) IMPLANT
BLADE RAD60 ROTATE M4 4 5PK (BLADE) IMPLANT
BLADE ROTATE RAD 12 4 M4 (BLADE) IMPLANT
BLADE ROTATE RAD 40 4 M4 (BLADE) IMPLANT
BLADE ROTATE TRICUT 4X13 M4 (BLADE) ×2 IMPLANT
BLADE TRICUT ROTATE M4 4 5PK (BLADE) IMPLANT
BUR HS RAD FRONTAL 3 (BURR) IMPLANT
CANISTER SUC SOCK COL 7IN (MISCELLANEOUS) ×2 IMPLANT
CANISTER SUCT 1200ML W/VALVE (MISCELLANEOUS) ×2 IMPLANT
CLEANER CAUTERY TIP 5X5 PAD (MISCELLANEOUS) IMPLANT
COAGULATOR SUCT 8FR VV (MISCELLANEOUS) ×2 IMPLANT
COVER PROBE W GEL 5X96 (DRAPES) IMPLANT
COVER WAND RF STERILE (DRAPES) IMPLANT
DECANTER SPIKE VIAL GLASS SM (MISCELLANEOUS) IMPLANT
DEVICE INFLATION SEID (MISCELLANEOUS) IMPLANT
DRAPE SURG 17X23 STRL (DRAPES) IMPLANT
DRESSING NASAL KENNEDY 3.5X.9 (MISCELLANEOUS) IMPLANT
DRSG CURAD 3X16 NADH (PACKING) IMPLANT
DRSG NASAL KENNEDY 3.5X.9 (MISCELLANEOUS)
DRSG NASAL KENNEDY LMNT 8CM (GAUZE/BANDAGES/DRESSINGS) IMPLANT
DRSG NASOPORE 8CM (GAUZE/BANDAGES/DRESSINGS) ×2 IMPLANT
DRSG TELFA 3X8 NADH (GAUZE/BANDAGES/DRESSINGS) ×2 IMPLANT
ELECT COATED BLADE 2.86 ST (ELECTRODE) IMPLANT
ELECT NEEDLE BLADE 2-5/6 (NEEDLE) IMPLANT
ELECT REM PT RETURN 9FT ADLT (ELECTROSURGICAL) ×2
ELECTRODE REM PT RTRN 9FT ADLT (ELECTROSURGICAL) ×1 IMPLANT
GLOVE BIOGEL PI IND STRL 7.0 (GLOVE) ×2 IMPLANT
GLOVE BIOGEL PI INDICATOR 7.0 (GLOVE) ×2
GLOVE ECLIPSE 6.5 STRL STRAW (GLOVE) ×2 IMPLANT
GLOVE SS BIOGEL STRL SZ 7.5 (GLOVE) ×1 IMPLANT
GLOVE SUPERSENSE BIOGEL SZ 7.5 (GLOVE) ×1
GOWN STRL REUS W/ TWL LRG LVL3 (GOWN DISPOSABLE) ×2 IMPLANT
GOWN STRL REUS W/ TWL XL LVL3 (GOWN DISPOSABLE) ×1 IMPLANT
GOWN STRL REUS W/TWL LRG LVL3 (GOWN DISPOSABLE) ×4
GOWN STRL REUS W/TWL XL LVL3 (GOWN DISPOSABLE) ×2
HEMOSTAT SURGICEL .5X2 ABSORB (HEMOSTASIS) IMPLANT
HEMOSTAT SURGICEL 2X14 (HEMOSTASIS) IMPLANT
INFLATOR BALLN (BALLOONS) ×1
INFLATOR BALLOON W/TUBE (BALLOONS) ×1 IMPLANT
IV NS 1000ML (IV SOLUTION)
IV NS 1000ML BAXH (IV SOLUTION) IMPLANT
IV NS 500ML (IV SOLUTION) ×2
IV NS 500ML BAXH (IV SOLUTION) ×1 IMPLANT
NEEDLE PRECISIONGLIDE 27X1.5 (NEEDLE) ×2 IMPLANT
NEEDLE SPNL 25GX3.5 QUINCKE BL (NEEDLE) IMPLANT
NS IRRIG 1000ML POUR BTL (IV SOLUTION) ×2 IMPLANT
PACK BASIN DAY SURGERY FS (CUSTOM PROCEDURE TRAY) ×2 IMPLANT
PACK ENT DAY SURGERY (CUSTOM PROCEDURE TRAY) ×2 IMPLANT
PAD CLEANER CAUTERY TIP 5X5 (MISCELLANEOUS)
PATTIES SURGICAL .5 X3 (DISPOSABLE) ×2 IMPLANT
PENCIL SMOKE EVACUATOR (MISCELLANEOUS) IMPLANT
SHEET SILICONE 2X3 0.02 REINF (MISCELLANEOUS) IMPLANT
SHEET SILICONE 2X3 0.03 REINF (MISCELLANEOUS) IMPLANT
SLEEVE SCD COMPRESS KNEE MED (MISCELLANEOUS) ×2 IMPLANT
SOL ANTI FOG 6CC (MISCELLANEOUS) ×1 IMPLANT
SOLUTION ANTI FOG 6CC (MISCELLANEOUS) ×1
SPLINT NASAL AIRWAY SILICONE (MISCELLANEOUS) ×2 IMPLANT
SPONGE GAUZE 2X2 8PLY STRL LF (GAUZE/BANDAGES/DRESSINGS) ×2 IMPLANT
SUT CHROMIC 4 0 PS 2 18 (SUTURE) ×4 IMPLANT
SUT ETHILON 3 0 PS 1 (SUTURE) ×2 IMPLANT
SUT SILK 2 0 PERMA HAND 18 BK (SUTURE) ×2 IMPLANT
SUT VIC AB 4-0 P-3 18XBRD (SUTURE) IMPLANT
SUT VIC AB 4-0 P3 18 (SUTURE)
SYR 3ML 18GX1 1/2 (SYRINGE) IMPLANT
TOWEL GREEN STERILE FF (TOWEL DISPOSABLE) ×4 IMPLANT
TRACKER ENT INSTRUMENT (MISCELLANEOUS) ×2 IMPLANT
TRACKER ENT PATIENT (MISCELLANEOUS) ×2 IMPLANT
TRAY DSU PREP LF (CUSTOM PROCEDURE TRAY) ×2 IMPLANT
TUBE CONNECTING 20X1/4 (TUBING) ×2 IMPLANT
TUBING STRAIGHTSHOT EPS 5PK (TUBING) ×2 IMPLANT
YANKAUER SUCT BULB TIP NO VENT (SUCTIONS) ×2 IMPLANT

## 2020-07-09 NOTE — Discharge Instructions (Signed)
Elevate head of bed and apply cool compress to nose to reduce bleeding and swelling Tylenol, ibuprofen or hydrocodone 5 mg tabs 1-2 every 6 hrs prn pain Keflex 500 mg bid for the next 10 days Return to Dr Allene Pyo office tomorrow at 11:45 to have nasal packs removed.   Post Anesthesia Home Care Instructions  Activity: Get plenty of rest for the remainder of the day. A responsible individual must stay with you for 24 hours following the procedure.  For the next 24 hours, DO NOT: -Drive a car -Advertising copywriter -Drink alcoholic beverages -Take any medication unless instructed by your physician -Make any legal decisions or sign important papers.  Meals: Start with liquid foods such as gelatin or soup. Progress to regular foods as tolerated. Avoid greasy, spicy, heavy foods. If nausea and/or vomiting occur, drink only clear liquids until the nausea and/or vomiting subsides. Call your physician if vomiting continues.  Special Instructions/Symptoms: Your throat may feel dry or sore from the anesthesia or the breathing tube placed in your throat during surgery. If this causes discomfort, gargle with warm salt water. The discomfort should disappear within 24 hours.  If you had a scopolamine patch placed behind your ear for the management of post- operative nausea and/or vomiting:  1. The medication in the patch is effective for 72 hours, after which it should be removed.  Wrap patch in a tissue and discard in the trash. Wash hands thoroughly with soap and water. 2. You may remove the patch earlier than 72 hours if you experience unpleasant side effects which may include dry mouth, dizziness or visual disturbances. 3. Avoid touching the patch. Wash your hands with soap and water after contact with the patch.

## 2020-07-09 NOTE — Brief Op Note (Signed)
07/09/2020  11:21 AM  PATIENT:  Donald Swanson  39 y.o. male  PRE-OPERATIVE DIAGNOSIS:  CHRONIC PANSINUSITIS,DEVIATED SEPTUM, NASAL HYPERTROPHY  POST-OPERATIVE DIAGNOSIS:  CHRONIC PANSINUSITIS,DEVIATED SEPTUM, NASAL HYPERTROPHY  PROCEDURE:  Procedure(s): NASAL SEPTOPLASTY WITH TURBINATE REDUCTION (Bilateral) TOTAL ETHMOIDECTOMY (Bilateral) ENDOSCOPIC SINUS WITH BALLOON DILATION FRONTAL OSTIUM (Bilateral) ENDOSCOPIC MAXILLARY ANTROSTOMY (Bilateral) SINUS ENDOSCOPY WITH FUSION NAVIGATION (Bilateral)  SURGEON:  Surgeon(s) and Role:    Drema Halon, MD - Primary  PHYSICIAN ASSISTANT:   ASSISTANTS: none   ANESTHESIA:   general  EBL:  300 mL   BLOOD ADMINISTERED:none  DRAINS: none   LOCAL MEDICATIONS USED:  XYLOCAINE w/ EPI  18 cc SPECIMEN:  No Specimen  DISPOSITION OF SPECIMEN:  N/A  COUNTS:  YES  TOURNIQUET:  * No tourniquets in log *  DICTATION: .Other Dictation: Dictation Number [id number]  PLAN OF CARE: Discharge to home after PACU  PATIENT DISPOSITION:  PACU - hemodynamically stable.   Delay start of Pharmacological VTE agent (>24hrs) due to surgical blood loss or risk of bleeding: yes

## 2020-07-09 NOTE — Transfer of Care (Signed)
Immediate Anesthesia Transfer of Care Note  Patient: Donald Swanson  Procedure(s) Performed: NASAL SEPTOPLASTY WITH TURBINATE REDUCTION (Bilateral Nose) TOTAL ETHMOIDECTOMY (Bilateral Nose) ENDOSCOPIC SINUS WITH BALLOON DILATION FRONTAL OSTIUM (Bilateral Nose) ENDOSCOPIC MAXILLARY ANTROSTOMY (Bilateral Nose) SINUS ENDOSCOPY WITH FUSION NAVIGATION (Bilateral Nose)  Patient Location: PACU  Anesthesia Type:General  Level of Consciousness: awake, alert  and oriented  Airway & Oxygen Therapy: Patient Spontanous Breathing and Patient connected to nasal cannula oxygen  Post-op Assessment: Report given to RN and Post -op Vital signs reviewed and stable  Post vital signs: Reviewed and stable  Last Vitals:  Vitals Value Taken Time  BP 151/96 07/09/20 1133  Temp    Pulse 74 07/09/20 1135  Resp 18 07/09/20 1135  SpO2 98 % 07/09/20 1135  Vitals shown include unvalidated device data.  Last Pain:  Vitals:   07/09/20 0718  TempSrc: Oral  PainSc: 0-No pain         Complications: No complications documented.

## 2020-07-09 NOTE — Anesthesia Procedure Notes (Signed)
Procedure Name: Intubation Date/Time: 07/09/2020 7:47 AM Performed by: Cleda Clarks, CRNA Pre-anesthesia Checklist: Patient identified, Emergency Drugs available, Suction available and Patient being monitored Patient Re-evaluated:Patient Re-evaluated prior to induction Oxygen Delivery Method: Circle system utilized Preoxygenation: Pre-oxygenation with 100% oxygen Induction Type: IV induction Ventilation: Mask ventilation without difficulty Laryngoscope Size: Miller and 2 Grade View: Grade II Tube type: Oral Tube size: 7.0 mm Number of attempts: 1 Airway Equipment and Method: Stylet and Oral airway Placement Confirmation: ETT inserted through vocal cords under direct vision,  positive ETCO2 and breath sounds checked- equal and bilateral Secured at: 21 cm Tube secured with: Tape Dental Injury: Teeth and Oropharynx as per pre-operative assessment

## 2020-07-09 NOTE — Interval H&P Note (Signed)
History and Physical Interval Note:  07/09/2020 7:27 AM  Donald Swanson  has presented today for surgery, with the diagnosis of CHRONIC PANSINUSITIS,DEVIATED SEPTUM, NASAL HYPERTROPHY.  The various methods of treatment have been discussed with the patient and family. After consideration of risks, benefits and other options for treatment, the patient has consented to  Procedure(s): NASAL SEPTOPLASTY WITH TURBINATE REDUCTION (Bilateral) TOTAL ETHMOIDECTOMY (N/A) ENDOSCOPIC SINUS WITH BALLOON DILATION FRONTAL OSTIUM (N/A) ENDOSCOPIC MAXILLARY ANTROSTOMY (N/A) SINUS ENDO WITH FUSION (N/A) as a surgical intervention.  The patient's history has been reviewed, patient examined, no change in status, stable for surgery.  I have reviewed the patient's chart and labs.  Questions were answered to the patient's satisfaction.     [name]

## 2020-07-09 NOTE — Anesthesia Preprocedure Evaluation (Signed)
Anesthesia Evaluation  Patient identified by MRN, date of birth, ID band Patient awake    Reviewed: Allergy & Precautions, NPO status , Patient's Chart, lab work & pertinent test results  Airway Mallampati: I  TM Distance: >3 FB Neck ROM: Full    Dental  (+) Teeth Intact, Dental Advisory Given   Pulmonary former smoker,    breath sounds clear to auscultation       Cardiovascular  Rhythm:Regular Rate:Normal     Neuro/Psych Depression    GI/Hepatic   Endo/Other    Renal/GU      Musculoskeletal   Abdominal   Peds  Hematology   Anesthesia Other Findings   Reproductive/Obstetrics                             Anesthesia Physical  Anesthesia Plan  ASA: II  Anesthesia Plan: General   Post-op Pain Management:    Induction: Intravenous  PONV Risk Score and Plan: 2 and Ondansetron, Midazolam and Treatment may vary due to age or medical condition  Airway Management Planned: Oral ETT  Additional Equipment:   Intra-op Plan:   Post-operative Plan: Extubation in OR  Informed Consent: I have reviewed the patients History and Physical, chart, labs and discussed the procedure including the risks, benefits and alternatives for the proposed anesthesia with the patient or authorized representative who has indicated his/her understanding and acceptance.     Dental advisory given  Plan Discussed with: CRNA, Anesthesiologist and Surgeon  Anesthesia Plan Comments:         Anesthesia Quick Evaluation

## 2020-07-09 NOTE — Anesthesia Postprocedure Evaluation (Signed)
Anesthesia Post Note  Patient: Donald Swanson  Procedure(s) Performed: NASAL SEPTOPLASTY WITH TURBINATE REDUCTION (Bilateral Nose) TOTAL ETHMOIDECTOMY (Bilateral Nose) ENDOSCOPIC SINUS WITH BALLOON DILATION FRONTAL OSTIUM (Bilateral Nose) ENDOSCOPIC MAXILLARY ANTROSTOMY (Bilateral Nose) SINUS ENDOSCOPY WITH FUSION NAVIGATION (Bilateral Nose)     Patient location during evaluation: PACU Anesthesia Type: General Level of consciousness: awake and alert Pain management: pain level controlled Vital Signs Assessment: post-procedure vital signs reviewed and stable Respiratory status: spontaneous breathing, nonlabored ventilation and respiratory function stable Cardiovascular status: blood pressure returned to baseline and stable Postop Assessment: no apparent nausea or vomiting Anesthetic complications: no   No complications documented.  Last Vitals:  Vitals:   07/09/20 1200 07/09/20 1215  BP: (!) 138/94 (!) 144/99  Pulse: 73 72  Resp: 11 16  Temp:  37 C  SpO2: 94% 96%    Last Pain:  Vitals:   07/09/20 1215  TempSrc:   PainSc: 5                  Lowella Curb

## 2020-07-09 NOTE — Op Note (Signed)
NAME: PAYSON, EVRARD MEDICAL RECORD PT:[id number] ACCOUNT [id number] DATE OF BIRTH:1981-05-06 FACILITY: [facility] LOCATION: [facility] PHYSICIAN:Kaileb Monsanto Braxton Feathers, MD  OPERATIVE REPORT  DATE OF PROCEDURE:  07/09/2020  PREOPERATIVE DIAGNOSES:   1.  Septal deviation with turbinate hypertrophy and nasal obstruction.   2.  Chronic sinus disease with ethmoid disease, maxillary disease and bilateral frontal sinus disease.  POSTOPERATIVE DIAGNOSES:   1.  Septal deviation with turbinate hypertrophy and nasal obstruction.   2.  Chronic sinus disease with ethmoid disease, maxillary disease and bilateral frontal sinus disease.  OPERATION PERFORMED: 1.  Septoplasty with bilateral inferior turbinate reductions.   2.  Functional endoscopic sinus surgery with bilateral total ethmoidectomy, bilateral maxillary ostial enlargement, balloon dilation of the frontal sinus ostia bilaterally.  SURGEON:  [name], MD.  ANESTHESIA:  General endotracheal.  COMPLICATIONS:  None.  ESTIMATED BLOOD LOSS:  100 mL.  BRIEF CLINICAL NOTE:  The patient is a 39 year old gentleman who has had a longstanding history of nasal obstruction and trouble breathing through his nose, especially on the left side.  He also has frequent pressure in the frontal area.  On exam, he has  a very severe septal deviation to the left with external nasal deformity.  On CT scan, he has mucoperiosteal thickening within the anterior ethmoid area that extends up into the nasofrontal area and into the floor of the frontal sinuses bilaterally.   The maxillary sinus was relatively clear, as are the sphenoid sinuses.  He is taken to the operating room at this time for septoplasty, turbinate reductions and functional endoscopic sinus surgery using the Fusion system.  DESCRIPTION OF PROCEDURE:  After adequate endotracheal anesthesia, the patient received 2 g Ancef IV preoperatively.  The Fusion system was calibrated and used  throughout the case.  Nose was then prepped with Betadine solution and draped out in sterile  towels.  The nose and septum were then injected with Xylocaine with epinephrine and the middle meatus was injected with Xylocaine with epinephrine for hemostasis.  Because of the severity of the septal deformity, the septoplasty had to be performed  before we could adequately visualize the left middle meatus area.  The patient apparently had what appeared to an old fracture of the septum with the septum bowing over to the left and the anterior caudal edge of the septum protruding into the right  nostril.  Mucoperichondrial flaps had to be  elevated on either side of the cartilaginous septum in order to correct this deformity.  A small strip of cartilaginous septum was removed from the maxillary crest and floor of the cartilaginous septum in  order to allow this to return back to the midline of the maxillary crest.  A vertical incision had to be made where the previous fracture was in order to straighten the anterior cartilaginous septum from the more posterior cartilaginous septum.  In  addition, mucoperiosteal flaps were elevated posteriorly on the bony septum and a large septal spur was removed from the left side.  This allowed the septum to return much better to midline.  Next, using the endoscopes, left anterior and posterior  ethmoidectomy was performed.  Using the Fusion navigation system, the frontal sinus ostia was identified with a locator and then using a balloon dilation, the frontal sinus ostia on the left side was dilated.  Following this, the left maxillary ostia was  identified and this was enlarged posteriorly with straight Thru-Cut forceps and anteriorly with backbiting forceps.  This completed the left endoscopic sinus surgery.  Next, the right side was approached in similar fashion.  The uncinate process was  incised and removed.  The anterior and posterior ethmoid areas were opened up with a  microdebrider.  Next, the nasofrontal area was approached after applying packs soaked in Afrin and adrenalin.  Under direct visualization, using a 30-degree scope and  the frontal sinus guide of the Fusion system, the frontal sinus ostia was identified and this was enlarged again with the balloon dilators that were passed up into the frontal sinus without difficulty.  After dilating the frontal sinus ostia, this  completed that portion of the procedure.  The maxillary ostia on the right side was likewise identified.  It was actually patent, but was enlarged posteriorly with straight Thru-Cut forceps and inferiorly with backbiting forceps.  Following completion of  this, inferior turbinate reductions were performed using the Medtronic turbinate blade.  A submucosal resection of the turbinates was performed bilaterally.  In addition, on the right side, which had the larger inferior turbinate, a small strip of  mucosa was excised with scissors and the mucosa was cauterized.  The inferior turbinates were outfractured bilaterally after performing submucosal reduction.  The hemitransfixion incision was closed with interrupted 4-0 chromic sutures and the septum was  basted with a 4-0 chromic suture.  Septal splints were secured to either side of the septum with a 4-0 nylon suture.  The middle meatus was packed with a Nasopore soaked in mupirocin 2% bilaterally and the floor of the nose was packed with rolled Telfa  soaked in mupirocin.  Oropharynx was suctioned.  The patient was awoken from anesthesia and transferred to recovery room  postoperatively doing well.  DISPOSITION:  The patient is discharged home later this morning on Keflex 500 mg b.i.d. for the next 10 days.  Tylenol, ibuprofen or hydrocodone 5 mg tablets 1-2 every 6 hours p.o. p.r.n. pain.  He will follow up in my office tomorrow to have his nasal  packs removed and will follow up in a week to have his septal splints removed.  VN/NUANCE   D:07/09/2020 T:07/09/2020 JOB:[id number]

## 2020-07-10 ENCOUNTER — Other Ambulatory Visit: Payer: Self-pay

## 2020-07-10 ENCOUNTER — Ambulatory Visit (INDEPENDENT_AMBULATORY_CARE_PROVIDER_SITE_OTHER): Payer: No Typology Code available for payment source | Admitting: Otolaryngology

## 2020-07-10 VITALS — Temp 97.3°F

## 2020-07-10 DIAGNOSIS — Z4889 Encounter for other specified surgical aftercare: Secondary | ICD-10-CM

## 2020-07-10 NOTE — Progress Notes (Signed)
HPI: Donald Swanson is a 39 y.o. male who presents one days s/p septoplasty, turbinate reductions and FESS with balloon dilation of the frontal sinuses.  He is doing well with moderate complaints of pain and discomfort.  Past Medical History:  Diagnosis Date  . Hyperlipemia   . Low testosterone    Past Surgical History:  Procedure Laterality Date  . KNEE ARTHROSCOPY WITH MEDIAL MENISECTOMY Right 06/10/2015   Procedure: knee arthroscopy with medial menisectomy  ;  Surgeon: Gean Birchwood, MD;  Location: [facility];  Service: Orthopedics;  Laterality: Right;   Social History   Socioeconomic History  . Marital status: Married    Spouse name: Not on file  . Number of children: 1  . Years of education: 59  . Highest education level: Not on file  Occupational History  . Occupation: Curator  Tobacco Use  . Smoking status: Former Smoker    Packs/day: 0.25    Years: 9.00    Pack years: 2.25    Types: Cigarettes  . Smokeless tobacco: Never Used  Vaping Use  . Vaping Use: Never used  Substance and Sexual Activity  . Alcohol use: Yes    Comment: occas  . Drug use: No  . Sexual activity: Not on file  Other Topics Concern  . Not on file  Social History Narrative   Fun: Work on cars, guns, build   Social Determinants of Corporate investment banker Strain:   . Difficulty of Paying Living Expenses: Not on file  Food Insecurity:   . Worried About Programme researcher, broadcasting/film/video in the Last Year: Not on file  . Ran Out of Food in the Last Year: Not on file  Transportation Needs:   . Lack of Transportation (Medical): Not on file  . Lack of Transportation (Non-Medical): Not on file  Physical Activity:   . Days of Exercise per Week: Not on file  . Minutes of Exercise per Session: Not on file  Stress:   . Feeling of Stress : Not on file  Social Connections:   . Frequency of Communication with Friends and Family: Not on file  . Frequency of Social Gatherings with Friends and  Family: Not on file  . Attends Religious Services: Not on file  . Active Member of Clubs or Organizations: Not on file  . Attends Banker Meetings: Not on file  . Marital Status: Not on file   Family History  Problem Relation Age of Onset  . COPD Mother    No Known Allergies Prior to Admission medications   Medication Sig Start Date End Date Taking? Authorizing Provider  cephALEXin (KEFLEX) 500 MG capsule Take 1 capsule (500 mg total) by mouth 2 (two) times daily. 07/09/20  Yes Drema Halon, MD  dicyclomine (BENTYL) 10 MG capsule TAKE 1 CAPSULE BY MOUTH 4 TIMES DAILY - BEFORE MEALS AND AT BEDTIME. 02/13/20  Yes Corwin Levins, MD  HYDROcodone-acetaminophen (NORCO/VICODIN) 5-325 MG tablet Take 1-2 tablets by mouth every 6 (six) hours as needed for up to 5 days for moderate pain. 07/09/20 07/14/20 Yes Drema Halon, MD  testosterone cypionate (DEPOTESTOSTERONE CYPIONATE) 200 MG/ML injection Inject into the muscle once a week.   Yes [provider]     Physical Exam: Nasal septal packing was removed in the office today with minimal bleeding on both sides.  After removing the packing suction of the nasal cavity was performed bilaterally with clear nasal passages and mild bleeding.  Assessment: S/p septoplasty, bilateral inferior turbinate reductions and functional endoscopic sinus surgery with balloon dilation of the nasal frontal ducts.  Nasal packing was removed in the office today.  Plan: He is instructed to use saline nasal irrigation once or twice a day and gave him sample of the NeilMed sinus rinse. He will follow-up in 1 week for recheck and removal of the septal splints.  He will continue with use of the antibiotic as prescribed.   Narda Bonds, MD

## 2020-07-15 ENCOUNTER — Encounter (HOSPITAL_BASED_OUTPATIENT_CLINIC_OR_DEPARTMENT_OTHER): Payer: Self-pay | Admitting: Otolaryngology

## 2020-07-17 ENCOUNTER — Encounter (INDEPENDENT_AMBULATORY_CARE_PROVIDER_SITE_OTHER): Payer: Self-pay | Admitting: Otolaryngology

## 2020-07-17 ENCOUNTER — Ambulatory Visit (INDEPENDENT_AMBULATORY_CARE_PROVIDER_SITE_OTHER): Payer: No Typology Code available for payment source | Admitting: Otolaryngology

## 2020-07-17 ENCOUNTER — Other Ambulatory Visit: Payer: Self-pay

## 2020-07-17 VITALS — Temp 96.4°F

## 2020-07-17 DIAGNOSIS — Z4889 Encounter for other specified surgical aftercare: Secondary | ICD-10-CM

## 2020-07-17 NOTE — Progress Notes (Signed)
HPI: Donald Swanson is a 39 y.o. male who presents 8 days s/p septoplasty and turbinate reductions.  Presents today to have his nasal splints removed..   Past Medical History:  Diagnosis Date  . Hyperlipemia   . Low testosterone    Past Surgical History:  Procedure Laterality Date  . ETHMOIDECTOMY Bilateral 07/09/2020   Procedure: TOTAL ETHMOIDECTOMY;  Surgeon: Drema Halon, MD;  Location: [facility];  Service: ENT;  Laterality: Bilateral;  . KNEE ARTHROSCOPY WITH MEDIAL MENISECTOMY Right 06/10/2015   Procedure: knee arthroscopy with medial menisectomy  ;  Surgeon: Gean Birchwood, MD;  Location: [facility];  Service: Orthopedics;  Laterality: Right;  . MAXILLARY ANTROSTOMY Bilateral 07/09/2020   Procedure: ENDOSCOPIC MAXILLARY ANTROSTOMY;  Surgeon: Drema Halon, MD;  Location: [facility];  Service: ENT;  Laterality: Bilateral;  . NASAL SEPTOPLASTY W/ TURBINOPLASTY Bilateral 07/09/2020   Procedure: NASAL SEPTOPLASTY WITH TURBINATE REDUCTION;  Surgeon: Drema Halon, MD;  Location: [facility];  Service: ENT;  Laterality: Bilateral;  . NASAL SINUS SURGERY Bilateral 07/09/2020   Procedure: ENDOSCOPIC SINUS WITH BALLOON DILATION FRONTAL OSTIUM;  Surgeon: Drema Halon, MD;  Location: [facility];  Service: ENT;  Laterality: Bilateral;  . SINUS ENDO WITH FUSION Bilateral 07/09/2020   Procedure: SINUS ENDOSCOPY WITH FUSION NAVIGATION;  Surgeon: Drema Halon, MD;  Location: [facility];  Service: ENT;  Laterality: Bilateral;   Social History   Socioeconomic History  . Marital status: Married    Spouse name: Not on file  . Number of children: 1  . Years of education: 22  . Highest education level: Not on file  Occupational History  . Occupation: Curator  Tobacco Use  . Smoking status: Former Smoker    Packs/day: 0.25    Years: 9.00    Pack years: 2.25    Types:  Cigarettes  . Smokeless tobacco: Never Used  Vaping Use  . Vaping Use: Never used  Substance and Sexual Activity  . Alcohol use: Yes    Comment: occas  . Drug use: No  . Sexual activity: Not on file  Other Topics Concern  . Not on file  Social History Narrative   Fun: Work on cars, guns, build   Social Determinants of Corporate investment banker Strain:   . Difficulty of Paying Living Expenses: Not on file  Food Insecurity:   . Worried About Programme researcher, broadcasting/film/video in the Last Year: Not on file  . Ran Out of Food in the Last Year: Not on file  Transportation Needs:   . Lack of Transportation (Medical): Not on file  . Lack of Transportation (Non-Medical): Not on file  Physical Activity:   . Days of Exercise per Week: Not on file  . Minutes of Exercise per Session: Not on file  Stress:   . Feeling of Stress : Not on file  Social Connections:   . Frequency of Communication with Friends and Family: Not on file  . Frequency of Social Gatherings with Friends and Family: Not on file  . Attends Religious Services: Not on file  . Active Member of Clubs or Organizations: Not on file  . Attends Banker Meetings: Not on file  . Marital Status: Not on file   Family History  Problem Relation Age of Onset  . COPD Mother    No Known Allergies Prior to Admission medications   Medication Sig Start Date End  Date Taking? Authorizing Provider  cephALEXin (KEFLEX) 500 MG capsule Take 1 capsule (500 mg total) by mouth 2 (two) times daily. 07/09/20  Yes Drema Halon, MD  dicyclomine (BENTYL) 10 MG capsule TAKE 1 CAPSULE BY MOUTH 4 TIMES DAILY - BEFORE MEALS AND AT BEDTIME. 02/13/20  Yes Corwin Levins, MD  testosterone cypionate (DEPOTESTOSTERONE CYPIONATE) 200 MG/ML injection Inject into the muscle once a week.   Yes [provider]     Physical Exam: Nasal splints were removed on both sides of the septum.  He had moderate edema of the inferior turbinate tissue.   Nose was cleaned with suction.   Assessment: S/p septoplasty and turbinate reductions on 07/09/2020  Plan: Splints were removed in the office today.  Recommended regular use of saline irrigations 2-3 times a day and will follow up in 7 to 10 days for recheck and cleaning the nose.   Narda Bonds, MD

## 2020-07-25 ENCOUNTER — Other Ambulatory Visit: Payer: Self-pay

## 2020-07-25 ENCOUNTER — Ambulatory Visit (INDEPENDENT_AMBULATORY_CARE_PROVIDER_SITE_OTHER): Payer: No Typology Code available for payment source | Admitting: Otolaryngology

## 2020-07-25 VITALS — Temp 97.2°F

## 2020-07-25 DIAGNOSIS — Z4889 Encounter for other specified surgical aftercare: Secondary | ICD-10-CM

## 2020-07-25 NOTE — Progress Notes (Signed)
HPI: Donald Swanson is a 39 y.o. male who presents 16 days s/p septoplasty turbinate reductions and FBSS with balloon dilation of the frontal sinuses.  He still complains of congestion more on the right side..   Past Medical History:  Diagnosis Date   Hyperlipemia    Low testosterone    Past Surgical History:  Procedure Laterality Date   ETHMOIDECTOMY Bilateral 07/09/2020   Procedure: TOTAL ETHMOIDECTOMY;  Surgeon: Drema Halon, MD;  Location: [facility];  Service: ENT;  Laterality: Bilateral;   KNEE ARTHROSCOPY WITH MEDIAL MENISECTOMY Right 06/10/2015   Procedure: knee arthroscopy with medial menisectomy  ;  Surgeon: Gean Birchwood, MD;  Location: [facility];  Service: Orthopedics;  Laterality: Right;   MAXILLARY ANTROSTOMY Bilateral 07/09/2020   Procedure: ENDOSCOPIC MAXILLARY ANTROSTOMY;  Surgeon: Drema Halon, MD;  Location: [facility];  Service: ENT;  Laterality: Bilateral;   NASAL SEPTOPLASTY W/ TURBINOPLASTY Bilateral 07/09/2020   Procedure: NASAL SEPTOPLASTY WITH TURBINATE REDUCTION;  Surgeon: Drema Halon, MD;  Location: [facility];  Service: ENT;  Laterality: Bilateral;   NASAL SINUS SURGERY Bilateral 07/09/2020   Procedure: ENDOSCOPIC SINUS WITH BALLOON DILATION FRONTAL OSTIUM;  Surgeon: Drema Halon, MD;  Location: [facility];  Service: ENT;  Laterality: Bilateral;   SINUS ENDO WITH FUSION Bilateral 07/09/2020   Procedure: SINUS ENDOSCOPY WITH FUSION NAVIGATION;  Surgeon: Drema Halon, MD;  Location: [facility];  Service: ENT;  Laterality: Bilateral;   Social History   Socioeconomic History   Marital status: Married    Spouse name: Not on file   Number of children: 1   Years of education: 14   Highest education level: Not on file  Occupational History   Occupation: Curator  Tobacco Use   Smoking status: Former Smoker     Packs/day: 0.25    Years: 9.00    Pack years: 2.25    Types: Cigarettes   Smokeless tobacco: Never Used  Building services engineer Use: Never used  Substance and Sexual Activity   Alcohol use: Yes    Comment: occas   Drug use: No   Sexual activity: Not on file  Other Topics Concern   Not on file  Social History Narrative   Fun: Work on cars, guns, build   Social Determinants of Corporate investment banker Strain:    Difficulty of Paying Living Expenses: Not on file  Food Insecurity:    Worried About Programme researcher, broadcasting/film/video in the Last Year: Not on file   [employer] of Food in the Last Year: Not on file  Transportation Needs:    Lack of Transportation (Medical): Not on file   Lack of Transportation (Non-Medical): Not on file  Physical Activity:    Days of Exercise per Week: Not on file   Minutes of Exercise per Session: Not on file  Stress:    Feeling of Stress : Not on file  Social Connections:    Frequency of Communication with Friends and Family: Not on file   Frequency of Social Gatherings with Friends and Family: Not on file   Attends Religious Services: Not on file   Active Member of Clubs or Organizations: Not on file   Attends Banker Meetings: Not on file   Marital Status: Not on file   Family History  Problem Relation Age of Onset   COPD Mother    No Known Allergies Prior  to Admission medications   Medication Sig Start Date End Date Taking? Authorizing Provider  cephALEXin (KEFLEX) 500 MG capsule Take 1 capsule (500 mg total) by mouth 2 (two) times daily. 07/09/20  Yes Drema Halon, MD  dicyclomine (BENTYL) 10 MG capsule TAKE 1 CAPSULE BY MOUTH 4 TIMES DAILY - BEFORE MEALS AND AT BEDTIME. 02/13/20  Yes Corwin Levins, MD  testosterone cypionate (DEPOTESTOSTERONE CYPIONATE) 200 MG/ML injection Inject into the muscle once a week.   Yes [provider]     Physical Exam: He has moderate congestion and swelling on both  sides right side worse than left.  Removed some scabbing from the nose in the office today.   Assessment: S/p recommended regular use of saline irrigations which he is using also prescribed Nasacort 2 sprays each nostril at night.  Plan: He will follow-up in 2 to 3 weeks for recheck and cleaning.   Narda Bonds, MD

## 2020-08-01 ENCOUNTER — Other Ambulatory Visit (HOSPITAL_COMMUNITY): Payer: Self-pay | Admitting: Urology

## 2020-08-05 ENCOUNTER — Other Ambulatory Visit (INDEPENDENT_AMBULATORY_CARE_PROVIDER_SITE_OTHER): Payer: Self-pay

## 2020-08-05 ENCOUNTER — Telehealth (INDEPENDENT_AMBULATORY_CARE_PROVIDER_SITE_OTHER): Payer: Self-pay

## 2020-08-05 MED ORDER — AZITHROMYCIN 1 G PO PACK: 1.0000 g | PACK | Freq: Once | ORAL | 0 refills | Status: DC

## 2020-08-05 MED FILL — AZITHROMYCIN 1 GM PWD PACKE: 1 | 1 days supply | Qty: 1 | Fill #0

## 2020-08-08 ENCOUNTER — Encounter (INDEPENDENT_AMBULATORY_CARE_PROVIDER_SITE_OTHER): Payer: Self-pay | Admitting: Otolaryngology

## 2020-08-08 ENCOUNTER — Ambulatory Visit (INDEPENDENT_AMBULATORY_CARE_PROVIDER_SITE_OTHER): Payer: No Typology Code available for payment source | Admitting: Otolaryngology

## 2020-08-08 ENCOUNTER — Other Ambulatory Visit: Payer: Self-pay

## 2020-08-08 VITALS — Temp 97.7°F

## 2020-08-08 DIAGNOSIS — Z4889 Encounter for other specified surgical aftercare: Secondary | ICD-10-CM

## 2020-08-08 NOTE — Progress Notes (Signed)
HPI: Donald Swanson is a 39 y.o. male who presents 4 weeks days s/p septoplasty, turbinate reductions and FESS. He still has some congestion on the right side. He had a recent sinus infection for which he is taking antibiotics..   Past Medical History:  Diagnosis Date  . Hyperlipemia   . Low testosterone    Past Surgical History:  Procedure Laterality Date  . ETHMOIDECTOMY Bilateral 07/09/2020   Procedure: TOTAL ETHMOIDECTOMY;  Surgeon: Drema Halon, MD;  Location: [facility];  Service: ENT;  Laterality: Bilateral;  . KNEE ARTHROSCOPY WITH MEDIAL MENISECTOMY Right 06/10/2015   Procedure: knee arthroscopy with medial menisectomy  ;  Surgeon: Gean Birchwood, MD;  Location: [facility];  Service: Orthopedics;  Laterality: Right;  . MAXILLARY ANTROSTOMY Bilateral 07/09/2020   Procedure: ENDOSCOPIC MAXILLARY ANTROSTOMY;  Surgeon: Drema Halon, MD;  Location: [facility];  Service: ENT;  Laterality: Bilateral;  . NASAL SEPTOPLASTY W/ TURBINOPLASTY Bilateral 07/09/2020   Procedure: NASAL SEPTOPLASTY WITH TURBINATE REDUCTION;  Surgeon: Drema Halon, MD;  Location: [facility];  Service: ENT;  Laterality: Bilateral;  . NASAL SINUS SURGERY Bilateral 07/09/2020   Procedure: ENDOSCOPIC SINUS WITH BALLOON DILATION FRONTAL OSTIUM;  Surgeon: Drema Halon, MD;  Location: [facility];  Service: ENT;  Laterality: Bilateral;  . SINUS ENDO WITH FUSION Bilateral 07/09/2020   Procedure: SINUS ENDOSCOPY WITH FUSION NAVIGATION;  Surgeon: Drema Halon, MD;  Location:  [facility];  Service: ENT;  Laterality: Bilateral;   Social History   Socioeconomic History  . Marital status: Married    Spouse name: Not on file  . Number of children: 1  . Years of education: 49  . Highest education level: Not on file  Occupational History  . Occupation: Curator  Tobacco Use  . Smoking status:  Former Smoker    Packs/day: 0.25    Years: 9.00    Pack years: 2.25    Types: Cigarettes  . Smokeless tobacco: Never Used  Vaping Use  . Vaping Use: Never used  Substance and Sexual Activity  . Alcohol use: Yes    Comment: occas  . Drug use: No  . Sexual activity: Not on file  Other Topics Concern  . Not on file  Social History Narrative   Fun: Work on cars, guns, build   Social Determinants of Corporate investment banker Strain:   . Difficulty of Paying Living Expenses: Not on file  Food Insecurity:   . Worried About Programme researcher, broadcasting/film/video in the Last Year: Not on file  . Ran Out of Food in the Last Year: Not on file  Transportation Needs:   . Lack of Transportation (Medical): Not on file  . Lack of Transportation (Non-Medical): Not on file  Physical Activity:   . Days of Exercise per Week: Not on file  . Minutes of Exercise per Session: Not on file  Stress:   . Feeling of Stress : Not on file  Social Connections:   . Frequency of Communication with Friends and Family: Not on file  . Frequency of Social Gatherings with Friends and Family: Not on file  . Attends Religious Services: Not on file  . Active Member of Clubs or Organizations: Not on file  . Attends Banker Meetings: Not on file  . Marital Status: Not on file   Family History  Problem Relation Age of Onset  . COPD Mother  No Known Allergies Prior to Admission medications   Medication Sig Start Date End Date Taking? Authorizing Provider  cephALEXin (KEFLEX) 500 MG capsule Take 1 capsule (500 mg total) by mouth 2 (two) times daily. 07/09/20   Drema Halon, MD  dicyclomine (BENTYL) 10 MG capsule TAKE 1 CAPSULE BY MOUTH 4 TIMES DAILY - BEFORE MEALS AND AT BEDTIME. 02/13/20   Corwin Levins, MD  testosterone cypionate (DEPOTESTOSTERONE CYPIONATE) 200 MG/ML injection Inject into the muscle once a week.    [provider]     Physical Exam: Has large amount of crusting that was  removed on the right inferior turbinate and right middle meatus region with improvement of his breathing.   Assessment: 4 weeks status post septoplasty, turbinate reductions and fess S/p he will continue with the saline irrigations and the antibiotic. He is also using nasal steroid spray at night.  Plan: He will follow-up in 7 to 10 days for recheck and cleaning.   Narda Bonds, MD

## 2020-08-15 ENCOUNTER — Other Ambulatory Visit: Payer: Self-pay

## 2020-08-15 ENCOUNTER — Other Ambulatory Visit (HOSPITAL_COMMUNITY): Payer: Self-pay | Admitting: Otolaryngology

## 2020-08-15 ENCOUNTER — Ambulatory Visit (INDEPENDENT_AMBULATORY_CARE_PROVIDER_SITE_OTHER): Payer: No Typology Code available for payment source | Admitting: Otolaryngology

## 2020-08-15 VITALS — Temp 97.5°F

## 2020-08-15 DIAGNOSIS — Z4889 Encounter for other specified surgical aftercare: Secondary | ICD-10-CM

## 2020-08-15 MED FILL — levoFLOXacin 500 MG TABS: 500 | 10 days supply | Qty: 10 | Fill #0

## 2020-08-15 MED FILL — predniSONE 20 MG TABS: 20 | 7 days supply | Qty: 14 | Fill #0

## 2020-08-15 NOTE — Progress Notes (Signed)
HPI: Donald Swanson is a 39 y.o. male who presents 5 weeks days s/p septoplasty, turbinate reductions with FESS and the balloon dilation of the frontal sinuses.  He has had some bad bleeding mostly from the left side over the past 24 hours.  He is also had pain pressure above the left eye in the frontal region..   Past Medical History:  Diagnosis Date   Hyperlipemia    Low testosterone    Past Surgical History:  Procedure Laterality Date   ETHMOIDECTOMY Bilateral 07/09/2020   Procedure: TOTAL ETHMOIDECTOMY;  Surgeon: Drema Halon, MD;  Location: [facility];  Service: ENT;  Laterality: Bilateral;   KNEE ARTHROSCOPY WITH MEDIAL MENISECTOMY Right 06/10/2015   Procedure: knee arthroscopy with medial menisectomy  ;  Surgeon: Gean Birchwood, MD;  Location: [facility];  Service: Orthopedics;  Laterality: Right;   MAXILLARY ANTROSTOMY Bilateral 07/09/2020   Procedure: ENDOSCOPIC MAXILLARY ANTROSTOMY;  Surgeon: Drema Halon, MD;  Location: [facility];  Service: ENT;  Laterality: Bilateral;   NASAL SEPTOPLASTY W/ TURBINOPLASTY Bilateral 07/09/2020   Procedure: NASAL SEPTOPLASTY WITH TURBINATE REDUCTION;  Surgeon: Drema Halon, MD;  Location: [facility];  Service: ENT;  Laterality: Bilateral;   NASAL SINUS SURGERY Bilateral 07/09/2020   Procedure: ENDOSCOPIC SINUS WITH BALLOON DILATION FRONTAL OSTIUM;  Surgeon: Drema Halon, MD;  Location: [facility];  Service: ENT;  Laterality: Bilateral;   SINUS ENDO WITH FUSION Bilateral 07/09/2020   Procedure: SINUS ENDOSCOPY WITH FUSION NAVIGATION;  Surgeon: Drema Halon, MD;  Location: [facility];  Service: ENT;  Laterality: Bilateral;   Social History   Socioeconomic History   Marital status: Married    Spouse name: Not on file   Number of children: 1   Years of education: 14   Highest education level: Not on file   Occupational History   Occupation: Curator  Tobacco Use   Smoking status: Former Smoker    Packs/day: 0.25    Years: 9.00    Pack years: 2.25    Types: Cigarettes   Smokeless tobacco: Never Used  Building services engineer Use: Never used  Substance and Sexual Activity   Alcohol use: Yes    Comment: occas   Drug use: No   Sexual activity: Not on file  Other Topics Concern   Not on file  Social History Narrative   Fun: Work on cars, guns, build   Social Determinants of Corporate investment banker Strain:    Difficulty of Paying Living Expenses: Not on file  Food Insecurity:    Worried About Programme researcher, broadcasting/film/video in the Last Year: Not on file   [employer] of Food in the Last Year: Not on file  Transportation Needs:    Lack of Transportation (Medical): Not on file   Lack of Transportation (Non-Medical): Not on file  Physical Activity:    Days of Exercise per Week: Not on file   Minutes of Exercise per Session: Not on file  Stress:    Feeling of Stress : Not on file  Social Connections:    Frequency of Communication with Friends and Family: Not on file   Frequency of Social Gatherings with Friends and Family: Not on file   Attends Religious Services: Not on file   Active Member of Clubs or Organizations: Not on file   Attends Banker Meetings: Not on file   Marital Status: Not  on file   Family History  Problem Relation Age of Onset   COPD Mother    No Known Allergies Prior to Admission medications   Medication Sig Start Date End Date Taking? Authorizing Provider  cephALEXin (KEFLEX) 500 MG capsule Take 1 capsule (500 mg total) by mouth 2 (two) times daily. 07/09/20   Drema Halon, MD  dicyclomine (BENTYL) 10 MG capsule TAKE 1 CAPSULE BY MOUTH 4 TIMES DAILY - BEFORE MEALS AND AT BEDTIME. 02/13/20   Corwin Levins, MD  testosterone cypionate (DEPOTESTOSTERONE CYPIONATE) 200 MG/ML injection Inject into the muscle once a week.    [provider]     Physical Exam: He had some blood clot in his nose a was able to clear himself as well as with suction.  After decongesting the nose nasal endoscopy was performed.  The left maxillary ostia was widely patent.  However he has some purulent discharge from the left middle meatus area.  There was no active bleeding noted but he has some slight inflammatory changes in the middle meatus as well as some inflammatory changes of the posterior left inferior turbinate where he had turbinate reductions performed.  But no active bleeding noted in the office today. On palpation of the frontal sinus region he is tender with no tenderness on palpation of the right frontal region.   Assessment: S/p septoplasty, turbinate reductions and FESS.  I suspect he is having some frontal sinus infection.  Bleeding most likely related to scabbing and crusting in the nose possibly from the left inferior turbinate reduction.  Plan: Placed him on Levaquin 500 mg daily for 10 days.  Prednisone 60 mg x 2 days followed by 40 mg for 3 days and 20 mg for 2 days. Recommended use of the saline irrigations.  Reviewed with him that if he has any bleeding would recommend irrigation with cold water with some Afrin in the water. He will notify us if he has any persistent bleeding that does not stop with irrigations. He will follow-up in 10 to 12 days for recheck.   Narda Bonds, MD

## 2020-08-19 ENCOUNTER — Other Ambulatory Visit (INDEPENDENT_AMBULATORY_CARE_PROVIDER_SITE_OTHER): Payer: No Typology Code available for payment source

## 2020-08-19 ENCOUNTER — Telehealth: Payer: Self-pay

## 2020-08-19 ENCOUNTER — Other Ambulatory Visit: Payer: Self-pay

## 2020-08-19 ENCOUNTER — Ambulatory Visit (INDEPENDENT_AMBULATORY_CARE_PROVIDER_SITE_OTHER): Payer: No Typology Code available for payment source | Admitting: Otolaryngology

## 2020-08-19 VITALS — Temp 94.3°F

## 2020-08-19 DIAGNOSIS — R040 Epistaxis: Secondary | ICD-10-CM | POA: Diagnosis not present

## 2020-08-19 DIAGNOSIS — Z4889 Encounter for other specified surgical aftercare: Secondary | ICD-10-CM

## 2020-08-19 LAB — CBC WITH DIFFERENTIAL/PLATELET
Basophils Absolute: 0 10*3/uL (ref 0.0–0.1)
Basophils Relative: 0.6 % (ref 0.0–3.0)
Eosinophils Absolute: 0.2 10*3/uL (ref 0.0–0.7)
Eosinophils Relative: 3.1 % (ref 0.0–5.0)
HCT: 34.9 % — ABNORMAL LOW (ref 39.0–52.0)
Hemoglobin: 12 g/dL — ABNORMAL LOW (ref 13.0–17.0)
Lymphocytes Relative: 33.1 % (ref 12.0–46.0)
Lymphs Abs: 2.1 10*3/uL (ref 0.7–4.0)
MCHC: 34.4 g/dL (ref 30.0–36.0)
MCV: 92.9 fl (ref 78.0–100.0)
Monocytes Absolute: 0.4 10*3/uL (ref 0.1–1.0)
Monocytes Relative: 7.1 % (ref 3.0–12.0)
Neutro Abs: 3.5 10*3/uL (ref 1.4–7.7)
Neutrophils Relative %: 56.1 % (ref 43.0–77.0)
Platelets: 215 10*3/uL (ref 150.0–400.0)
RBC: 3.76 Mil/uL — ABNORMAL LOW (ref 4.22–5.81)
RDW: 13.7 % (ref 11.5–15.5)
WBC: 6.2 10*3/uL (ref 4.0–10.5)

## 2020-08-19 NOTE — Telephone Encounter (Signed)
Ordered for ELam which will be quicker

## 2020-08-19 NOTE — Progress Notes (Signed)
HPI: Donald Swanson is a 39 y.o. male who presents 5 weeks s/p septoplasty, turbinate reductions and FESS.  I had seen him last Thursday because of some bleeding from the left side of his nose.  This was cleaned in the office and patient had no active bleeding noted.  However he developed severe bleeding Friday morning and had to be seen at [facility] where packs were placed on both sides of the nose in order to stop the bleeding.  Since the packs were placed he has had no bleeding this past weekend.  The bleeding started again on the left side..   Past Medical History:  Diagnosis Date  . Hyperlipemia   . Low testosterone    Past Surgical History:  Procedure Laterality Date  . ETHMOIDECTOMY Bilateral 07/09/2020   Procedure: TOTAL ETHMOIDECTOMY;  Surgeon: Drema Halon, MD;  Location: [facility];  Service: ENT;  Laterality: Bilateral;  . KNEE ARTHROSCOPY WITH MEDIAL MENISECTOMY Right 06/10/2015   Procedure: knee arthroscopy with medial menisectomy  ;  Surgeon: Gean Birchwood, MD;  Location: [facility];  Service: Orthopedics;  Laterality: Right;  . MAXILLARY ANTROSTOMY Bilateral 07/09/2020   Procedure: ENDOSCOPIC MAXILLARY ANTROSTOMY;  Surgeon: Drema Halon, MD;  Location: [facility];  Service: ENT;  Laterality: Bilateral;  . NASAL SEPTOPLASTY W/ TURBINOPLASTY Bilateral 07/09/2020   Procedure: NASAL SEPTOPLASTY WITH TURBINATE REDUCTION;  Surgeon: Drema Halon, MD;  Location: [facility];  Service: ENT;  Laterality: Bilateral;  . NASAL SINUS SURGERY Bilateral 07/09/2020   Procedure: ENDOSCOPIC SINUS WITH BALLOON DILATION FRONTAL OSTIUM;  Surgeon: Drema Halon, MD;  Location: [facility];  Service: ENT;  Laterality: Bilateral;  . SINUS ENDO WITH FUSION Bilateral 07/09/2020   Procedure: SINUS ENDOSCOPY WITH FUSION NAVIGATION;  Surgeon: Drema Halon, MD;  Location: [facility];  Service: ENT;  Laterality: Bilateral;   Social History   Socioeconomic History  . Marital status: Married    Spouse name: Not on file  . Number of children: 1  . Years of education: 81  . Highest education level: Not on file  Occupational History  . Occupation: Curator  Tobacco Use  . Smoking status: Former Smoker    Packs/day: 0.25    Years: 9.00    Pack years: 2.25    Types: Cigarettes  . Smokeless tobacco: Never Used  Vaping Use  . Vaping Use: Never used  Substance and Sexual Activity  . Alcohol use: Yes    Comment: occas  . Drug use: No  . Sexual activity: Not on file  Other Topics Concern  . Not on file  Social History Narrative   Fun: Work on cars, guns, build   Social Determinants of Corporate investment banker Strain:   . Difficulty of Paying Living Expenses: Not on file  Food Insecurity:   . Worried About Programme researcher, broadcasting/film/video in the Last Year: Not on file  . Ran Out of Food in the Last Year: Not on file  Transportation Needs:   . Lack of Transportation (Medical): Not on file  . Lack of Transportation (Non-Medical): Not on file  Physical Activity:   . Days of Exercise per Week: Not on file  . Minutes of Exercise per Session: Not on file  Stress:   . Feeling of Stress : Not on file  Social Connections:   . Frequency of Communication with Friends and Family: Not on file  .  Frequency of Social Gatherings with Friends and Family: Not on file  . Attends Religious Services: Not on file  . Active Member of Clubs or Organizations: Not on file  . Attends Banker Meetings: Not on file  . Marital Status: Not on file   Family History  Problem Relation Age of Onset  . COPD Mother    No Known Allergies Prior to Admission medications   Medication Sig Start Date End Date Taking? Authorizing Provider  cephALEXin (KEFLEX) 500 MG capsule Take 1 capsule (500 mg total) by mouth 2 (two) times daily. 07/09/20   Drema Halon, MD  dicyclomine  (BENTYL) 10 MG capsule TAKE 1 CAPSULE BY MOUTH 4 TIMES DAILY - BEFORE MEALS AND AT BEDTIME. 02/13/20   Corwin Levins, MD  testosterone cypionate (DEPOTESTOSTERONE CYPIONATE) 200 MG/ML injection Inject into the muscle once a week.    [provider]     Physical Exam: He has Rhino Rocket's placed on both sides of the nose.  The right Rhino Rocket was removed in the office today with no bleeding.  On nasal Endo the nasal passageway was clear and the nasopharynx was clear.   Assessment: S/p septoplasty turbinate reductions and FESS 5 weeks ago.  He has had recurrent bleeding from the left side over the past 2 weeks.  He was seen emergently on Friday and had Rhino Rocket's placed in order to stop the bleeding.  Plan: The right Rhino Rocket was removed in the office today. Patient was sent for hemoglobin hematocrit and this demonstrated hemoglobin of 12 and hematocrit of 35.  Previously 6 months ago he was at 7 and 55. He is having no bleeding presently.  I will have him follow-up in 2 days to have the left Rhino Rocket removed.  He is presently on antibiotics.   Narda Bonds, MD

## 2020-08-19 NOTE — Telephone Encounter (Signed)
Sent Dr. Lawerance Bach a message for a STAT Hemoglobin and hematocrit lab to be order for pt as Dr. Ezzard Standing has called the office asking if these test can be done due to pt having severe nose bleeds sine his surgery in September. Pt will be following up with Dr. Ezzard Standing this afternoon.

## 2020-08-20 ENCOUNTER — Encounter (INDEPENDENT_AMBULATORY_CARE_PROVIDER_SITE_OTHER): Payer: No Typology Code available for payment source | Admitting: Otolaryngology

## 2020-08-21 ENCOUNTER — Other Ambulatory Visit (INDEPENDENT_AMBULATORY_CARE_PROVIDER_SITE_OTHER): Payer: Self-pay

## 2020-08-21 ENCOUNTER — Ambulatory Visit (INDEPENDENT_AMBULATORY_CARE_PROVIDER_SITE_OTHER): Payer: No Typology Code available for payment source | Admitting: Otolaryngology

## 2020-08-21 ENCOUNTER — Other Ambulatory Visit: Payer: Self-pay

## 2020-08-21 VITALS — Temp 97.3°F

## 2020-08-21 DIAGNOSIS — Z4889 Encounter for other specified surgical aftercare: Secondary | ICD-10-CM

## 2020-08-21 NOTE — Progress Notes (Signed)
HPI: Donald Swanson is a 39 y.o. male who presents 6 weeks s/p septoplasty, turbinate reductions and FESS.  This was complicated by severe nosebleed that occurred last Friday requiring bilateral nasal packing with Rhino Rocket's performed at the ED in [place].  His bleeding has been from the left side of his nose.  The Rhino Rocket was removed from the right side of his nose on Monday he returns today to have the packing removed to the left side of his nose.  He has had no further bleeding..   Past Medical History:  Diagnosis Date  . Hyperlipemia   . Low testosterone    Past Surgical History:  Procedure Laterality Date  . ETHMOIDECTOMY Bilateral 07/09/2020   Procedure: TOTAL ETHMOIDECTOMY;  Surgeon: Drema Halon, MD;  Location: [facility];  Service: ENT;  Laterality: Bilateral;  . KNEE ARTHROSCOPY WITH MEDIAL MENISECTOMY Right 06/10/2015   Procedure: knee arthroscopy with medial menisectomy  ;  Surgeon: Gean Birchwood, MD;  Location: [facility];  Service: Orthopedics;  Laterality: Right;  . MAXILLARY ANTROSTOMY Bilateral 07/09/2020   Procedure: ENDOSCOPIC MAXILLARY ANTROSTOMY;  Surgeon: Drema Halon, MD;  Location: [facility];  Service: ENT;  Laterality: Bilateral;  . NASAL SEPTOPLASTY W/ TURBINOPLASTY Bilateral 07/09/2020   Procedure: NASAL SEPTOPLASTY WITH TURBINATE REDUCTION;  Surgeon: Drema Halon, MD;  Location: [facility];  Service: ENT;  Laterality: Bilateral;  . NASAL SINUS SURGERY Bilateral 07/09/2020   Procedure: ENDOSCOPIC SINUS WITH BALLOON DILATION FRONTAL OSTIUM;  Surgeon: Drema Halon, MD;  Location: [facility];  Service: ENT;  Laterality: Bilateral;  . SINUS ENDO WITH FUSION Bilateral 07/09/2020   Procedure: SINUS ENDOSCOPY WITH FUSION NAVIGATION;  Surgeon: Drema Halon, MD;  Location: [facility];  Service: ENT;  Laterality: Bilateral;   Social  History   Socioeconomic History  . Marital status: Married    Spouse name: Not on file  . Number of children: 1  . Years of education: 83  . Highest education level: Not on file  Occupational History  . Occupation: Curator  Tobacco Use  . Smoking status: Former Smoker    Packs/day: 0.25    Years: 9.00    Pack years: 2.25    Types: Cigarettes  . Smokeless tobacco: Never Used  Vaping Use  . Vaping Use: Never used  Substance and Sexual Activity  . Alcohol use: Yes    Comment: occas  . Drug use: No  . Sexual activity: Not on file  Other Topics Concern  . Not on file  Social History Narrative   Fun: Work on cars, guns, build   Social Determinants of Corporate investment banker Strain:   . Difficulty of Paying Living Expenses: Not on file  Food Insecurity:   . Worried About Programme researcher, broadcasting/film/video in the Last Year: Not on file  . Ran Out of Food in the Last Year: Not on file  Transportation Needs:   . Lack of Transportation (Medical): Not on file  . Lack of Transportation (Non-Medical): Not on file  Physical Activity:   . Days of Exercise per Week: Not on file  . Minutes of Exercise per Session: Not on file  Stress:   . Feeling of Stress : Not on file  Social Connections:   . Frequency of Communication with Friends and Family: Not on file  . Frequency of Social Gatherings with Friends and Family: Not on file  .  Attends Religious Services: Not on file  . Active Member of Clubs or Organizations: Not on file  . Attends Banker Meetings: Not on file  . Marital Status: Not on file   Family History  Problem Relation Age of Onset  . COPD Mother    No Known Allergies Prior to Admission medications   Medication Sig Start Date End Date Taking? Authorizing Provider  cephALEXin (KEFLEX) 500 MG capsule Take 1 capsule (500 mg total) by mouth 2 (two) times daily. 07/09/20   Drema Halon, MD  dicyclomine (BENTYL) 10 MG capsule TAKE 1 CAPSULE BY MOUTH 4 TIMES  DAILY - BEFORE MEALS AND AT BEDTIME. 02/13/20   Corwin Levins, MD  testosterone cypionate (DEPOTESTOSTERONE CYPIONATE) 200 MG/ML injection Inject into the muscle once a week.    [provider]     Physical Exam: Rhino Rocket was removed from the left side of the nose in the office today with no further bleeding.  On nasal endoscopy the middle meatus region appears clear as does the maxillary ostia on the left side.  Still has some slight crusting and scabbing in superior anterior ethmoid on the left side towards the nasofrontal duct region.  He had minimal crusting on the right side that was removed in the office today.   Assessment: S/p septoplasty, turbinate reductions and FESS.  This was complicated by a bad nosebleed from the left side last week requiring nasal packing.  Plan: The Rhino Rocket was removed from the left side in the office today with no further bleeding.  Recommended taking it easy for the next 3 to 4 days and wrote him a note for being out of work through the end of this week. He will follow-up next Tuesday for recheck.   Narda Bonds, MD

## 2020-08-27 ENCOUNTER — Ambulatory Visit (INDEPENDENT_AMBULATORY_CARE_PROVIDER_SITE_OTHER): Payer: No Typology Code available for payment source | Admitting: Otolaryngology

## 2020-08-27 ENCOUNTER — Encounter (INDEPENDENT_AMBULATORY_CARE_PROVIDER_SITE_OTHER): Payer: Self-pay | Admitting: Otolaryngology

## 2020-08-27 ENCOUNTER — Other Ambulatory Visit: Payer: Self-pay

## 2020-08-27 VITALS — Temp 97.5°F

## 2020-08-27 DIAGNOSIS — Z4889 Encounter for other specified surgical aftercare: Secondary | ICD-10-CM

## 2020-08-27 NOTE — Progress Notes (Signed)
HPI: Donald Swanson is a 39 y.o. male who presents 47 days s/p septoplasty and turbinate reductions and FESS.  This was complicated by significant bleeding 2 weeks ago.  He has had no further bleeding since last seen 1 week ago.  He is doing much better..   Past Medical History:  Diagnosis Date  . Hyperlipemia   . Low testosterone    Past Surgical History:  Procedure Laterality Date  . ETHMOIDECTOMY Bilateral 07/09/2020   Procedure: TOTAL ETHMOIDECTOMY;  Surgeon: Drema Halon, MD;  Location: [facility];  Service: ENT;  Laterality: Bilateral;  . KNEE ARTHROSCOPY WITH MEDIAL MENISECTOMY Right 06/10/2015   Procedure: knee arthroscopy with medial menisectomy  ;  Surgeon: Gean Birchwood, MD;  Location: [facility];  Service: Orthopedics;  Laterality: Right;  . MAXILLARY ANTROSTOMY Bilateral 07/09/2020   Procedure: ENDOSCOPIC MAXILLARY ANTROSTOMY;  Surgeon: Drema Halon, MD;  Location: [facility];  Service: ENT;  Laterality: Bilateral;  . NASAL SEPTOPLASTY W/ TURBINOPLASTY Bilateral 07/09/2020   Procedure: NASAL SEPTOPLASTY WITH TURBINATE REDUCTION;  Surgeon: Drema Halon, MD;  Location: [facility];  Service: ENT;  Laterality: Bilateral;  . NASAL SINUS SURGERY Bilateral 07/09/2020   Procedure: ENDOSCOPIC SINUS WITH BALLOON DILATION FRONTAL OSTIUM;  Surgeon: Drema Halon, MD;  Location: [facility];  Service: ENT;  Laterality: Bilateral;  . SINUS ENDO WITH FUSION Bilateral 07/09/2020   Procedure: SINUS ENDOSCOPY WITH FUSION NAVIGATION;  Surgeon: Drema Halon, MD;  Location: [facility];  Service: ENT;  Laterality: Bilateral;   Social History   Socioeconomic History  . Marital status: Married    Spouse name: Not on file  . Number of children: 1  . Years of education: 60  . Highest education level: Not on file  Occupational History  . Occupation: Curator  Tobacco  Use  . Smoking status: Former Smoker    Packs/day: 0.25    Years: 9.00    Pack years: 2.25    Types: Cigarettes  . Smokeless tobacco: Never Used  Vaping Use  . Vaping Use: Never used  Substance and Sexual Activity  . Alcohol use: Yes    Comment: occas  . Drug use: No  . Sexual activity: Not on file  Other Topics Concern  . Not on file  Social History Narrative   Fun: Work on cars, guns, build   Social Determinants of Corporate investment banker Strain:   . Difficulty of Paying Living Expenses: Not on file  Food Insecurity:   . Worried About Programme researcher, broadcasting/film/video in the Last Year: Not on file  . Ran Out of Food in the Last Year: Not on file  Transportation Needs:   . Lack of Transportation (Medical): Not on file  . Lack of Transportation (Non-Medical): Not on file  Physical Activity:   . Days of Exercise per Week: Not on file  . Minutes of Exercise per Session: Not on file  Stress:   . Feeling of Stress : Not on file  Social Connections:   . Frequency of Communication with Friends and Family: Not on file  . Frequency of Social Gatherings with Friends and Family: Not on file  . Attends Religious Services: Not on file  . Active Member of Clubs or Organizations: Not on file  . Attends Banker Meetings: Not on file  . Marital Status: Not on file   Family History  Problem Relation Age of  Onset  . COPD Mother    No Known Allergies Prior to Admission medications   Medication Sig Start Date End Date Taking? Authorizing Provider  cephALEXin (KEFLEX) 500 MG capsule Take 1 capsule (500 mg total) by mouth 2 (two) times daily. 07/09/20   Drema Halon, MD  dicyclomine (BENTYL) 10 MG capsule TAKE 1 CAPSULE BY MOUTH 4 TIMES DAILY - BEFORE MEALS AND AT BEDTIME. 02/13/20   Corwin Levins, MD  testosterone cypionate (DEPOTESTOSTERONE CYPIONATE) 200 MG/ML injection Inject into the muscle once a week.    [provider]     Physical Exam: He had minimal  crusting within the nasal cavity.  This was removed with forceps.  On nasal endoscopy both middle meatus regions were widely patent with mostly clear mucus discharge and no mucopurulent discharge.   Assessment: S/p septoplasty, turbinate reductions and FESS with balloon dilation of the frontal ducts.  Plan: He has not been using the saline rinses because of concerns of bleeding.  I suggested that he use saline rinse once a day for the next few weeks as he still has a little bit of crusting remaining.  He will follow-up in 2 weeks for recheck.   Narda Bonds, MD

## 2020-08-29 ENCOUNTER — Encounter (INDEPENDENT_AMBULATORY_CARE_PROVIDER_SITE_OTHER): Payer: No Typology Code available for payment source | Admitting: Otolaryngology

## 2020-09-12 ENCOUNTER — Other Ambulatory Visit: Payer: Self-pay

## 2020-09-12 ENCOUNTER — Ambulatory Visit (INDEPENDENT_AMBULATORY_CARE_PROVIDER_SITE_OTHER): Payer: No Typology Code available for payment source | Admitting: Otolaryngology

## 2020-09-12 VITALS — Temp 97.7°F

## 2020-09-12 DIAGNOSIS — Z4889 Encounter for other specified surgical aftercare: Secondary | ICD-10-CM

## 2020-09-12 NOTE — Progress Notes (Signed)
HPI: Donald Swanson is a 39 y.o. male who presents 2 months s/p septoplasty, turbinate reductions and fess.  This was complicated by postop nasal bleeding on the left side.  He has had no further bleeding over the past 3 weeks.  He still gets some scabbing and crusting out but fairly minimal..   Past Medical History:  Diagnosis Date   Hyperlipemia    Low testosterone    Past Surgical History:  Procedure Laterality Date   ETHMOIDECTOMY Bilateral 07/09/2020   Procedure: TOTAL ETHMOIDECTOMY;  Surgeon: Drema Halon, MD;  Location: [facility];  Service: ENT;  Laterality: Bilateral;   KNEE ARTHROSCOPY WITH MEDIAL MENISECTOMY Right 06/10/2015   Procedure: knee arthroscopy with medial menisectomy  ;  Surgeon: Gean Birchwood, MD;  Location: [facility];  Service: Orthopedics;  Laterality: Right;   MAXILLARY ANTROSTOMY Bilateral 07/09/2020   Procedure: ENDOSCOPIC MAXILLARY ANTROSTOMY;  Surgeon: Drema Halon, MD;  Location: [facility];  Service: ENT;  Laterality: Bilateral;   NASAL SEPTOPLASTY W/ TURBINOPLASTY Bilateral 07/09/2020   Procedure: NASAL SEPTOPLASTY WITH TURBINATE REDUCTION;  Surgeon: Drema Halon, MD;  Location: [facility];  Service: ENT;  Laterality: Bilateral;   NASAL SINUS SURGERY Bilateral 07/09/2020   Procedure: ENDOSCOPIC SINUS WITH BALLOON DILATION FRONTAL OSTIUM;  Surgeon: Drema Halon, MD;  Location: [facility];  Service: ENT;  Laterality: Bilateral;   SINUS ENDO WITH FUSION Bilateral 07/09/2020   Procedure: SINUS ENDOSCOPY WITH FUSION NAVIGATION;  Surgeon: Drema Halon, MD;  Location:  [facility]ERY [facility];  Service: ENT;  Laterality: Bilateral;   Social History   Socioeconomic History   Marital status: Married    Spouse name: Not on file   Number of children: 1   Years of education: 14   Highest education level: Not on file  Occupational  History   Occupation: Curator  Tobacco Use   Smoking status: Former Smoker    Packs/day: 0.25    Years: 9.00    Pack years: 2.25    Types: Cigarettes   Smokeless tobacco: Never Used  Building services engineer Use: Never used  Substance and Sexual Activity   Alcohol use: Yes    Comment: occas   Drug use: No   Sexual activity: Not on file  Other Topics Concern   Not on file  Social History Narrative   Fun: Work on cars, guns, build   Social Determinants of Corporate investment banker Strain:    Difficulty of Paying Living Expenses: Not on file  Food Insecurity:    Worried About Programme researcher, broadcasting/film/video in the Last Year: Not on file   [employer] of Food in the Last Year: Not on file  Transportation Needs:    Lack of Transportation (Medical): Not on file   Lack of Transportation (Non-Medical): Not on file  Physical Activity:    Days of Exercise per Week: Not on file   Minutes of Exercise per Session: Not on file  Stress:    Feeling of Stress : Not on file  Social Connections:    Frequency of Communication with Friends and Family: Not on file   Frequency of Social Gatherings with Friends and Family: Not on file   Attends Religious Services: Not on file   Active Member of Clubs or Organizations: Not on file   Attends Banker Meetings: Not on file   Marital Status: Not on file   Family  History  Problem Relation Age of Onset   COPD Mother    No Known Allergies Prior to Admission medications   Medication Sig Start Date End Date Taking? Authorizing Provider  cephALEXin (KEFLEX) 500 MG capsule Take 1 capsule (500 mg total) by mouth 2 (two) times daily. 07/09/20  Yes Drema Halon, MD  dicyclomine (BENTYL) 10 MG capsule TAKE 1 CAPSULE BY MOUTH 4 TIMES DAILY - BEFORE MEALS AND AT BEDTIME. 02/13/20  Yes Corwin Levins, MD  testosterone cypionate (DEPOTESTOSTERONE CYPIONATE) 200 MG/ML injection Inject into the muscle once a week.   Yes [provider]     Physical Exam: He has slight crusting along the inferior turbinates bilaterally that was removed and minimal crusting within the middle meatus that was cleaned with suction.  No signs of infection.   Assessment: S/p septoplasty, turbinate reductions and fess with balloon dilation of the frontal sinuses.  Plan: Recommended use of saline rinses once a day and gave him a sample of a Nettie pot to use. He will follow-up in 1 month for recheck.   Narda Bonds, MD

## 2020-10-17 ENCOUNTER — Ambulatory Visit (INDEPENDENT_AMBULATORY_CARE_PROVIDER_SITE_OTHER): Payer: No Typology Code available for payment source | Admitting: Otolaryngology

## 2020-10-17 ENCOUNTER — Other Ambulatory Visit: Payer: Self-pay

## 2020-10-17 VITALS — Temp 97.2°F

## 2020-10-17 DIAGNOSIS — Z4889 Encounter for other specified surgical aftercare: Secondary | ICD-10-CM

## 2020-10-17 NOTE — Progress Notes (Signed)
HPI: Donald Swanson is a 40 y.o. male who presents 3 months days s/p septoplasty, turbinate reductions and fess.  He is doing well occasionally gets some scabbing crusting in his nose.  Uses saline rinse intermittently..   Past Medical History:  Diagnosis Date  . Hyperlipemia   . Low testosterone    Past Surgical History:  Procedure Laterality Date  . ETHMOIDECTOMY Bilateral 07/09/2020   Procedure: TOTAL ETHMOIDECTOMY;  Surgeon: Drema Halon, MD;  Location: [facility];  Service: ENT;  Laterality: Bilateral;  . KNEE ARTHROSCOPY WITH MEDIAL MENISECTOMY Right 06/10/2015   Procedure: knee arthroscopy with medial menisectomy  ;  Surgeon: Gean Birchwood, MD;  Location: [facility];  Service: Orthopedics;  Laterality: Right;  . MAXILLARY ANTROSTOMY Bilateral 07/09/2020   Procedure: ENDOSCOPIC MAXILLARY ANTROSTOMY;  Surgeon: Drema Halon, MD;  Location: [facility];  Service: ENT;  Laterality: Bilateral;  . NASAL SEPTOPLASTY W/ TURBINOPLASTY Bilateral 07/09/2020   Procedure: NASAL SEPTOPLASTY WITH TURBINATE REDUCTION;  Surgeon: Drema Halon, MD;  Location: [facility];  Service: ENT;  Laterality: Bilateral;  . NASAL SINUS SURGERY Bilateral 07/09/2020   Procedure: ENDOSCOPIC SINUS WITH BALLOON DILATION FRONTAL OSTIUM;  Surgeon: Drema Halon, MD;  Location: [facility];  Service: ENT;  Laterality: Bilateral;  . SINUS ENDO WITH FUSION Bilateral 07/09/2020   Procedure: SINUS ENDOSCOPY WITH FUSION NAVIGATION;  Surgeon: Drema Halon, MD;  Location: [facility];  Service: ENT;  Laterality: Bilateral;   Social History   Socioeconomic History  . Marital status: Married    Spouse name: Not on file  . Number of children: 1  . Years of education: 43  . Highest education level: Not on file  Occupational History  . Occupation: Curator  Tobacco Use  . Smoking status: Former  Smoker    Packs/day: 0.25    Years: 9.00    Pack years: 2.25    Types: Cigarettes  . Smokeless tobacco: Never Used  Vaping Use  . Vaping Use: Never used  Substance and Sexual Activity  . Alcohol use: Yes    Comment: occas  . Drug use: No  . Sexual activity: Not on file  Other Topics Concern  . Not on file  Social History Narrative   Fun: Work on cars, guns, build   Social Determinants of Corporate investment banker Strain: Not on file  Food Insecurity: Not on file  Transportation Needs: Not on file  Physical Activity: Not on file  Stress: Not on file  Social Connections: Not on file   Family History  Problem Relation Age of Onset  . COPD Mother    No Known Allergies Prior to Admission medications   Medication Sig Start Date End Date Taking? Authorizing Provider  cephALEXin (KEFLEX) 500 MG capsule Take 1 capsule (500 mg total) by mouth 2 (two) times daily. 07/09/20   Drema Halon, MD  dicyclomine (BENTYL) 10 MG capsule TAKE 1 CAPSULE BY MOUTH 4 TIMES DAILY - BEFORE MEALS AND AT BEDTIME. 02/13/20   Corwin Levins, MD  testosterone cypionate (DEPOTESTOSTERONE CYPIONATE) 200 MG/ML injection Inject into the muscle once a week.    [provider]     Physical Exam: On nasal endoscopy middle meatus regions are clear maxillary ostia's are clear and sinus cavity is clear bilaterally.  Minimal crusting.   Assessment: S/p status post septoplasty, turbinate reductions and fess doing well.  Plan: He will follow-up as  needed.   Narda Bonds, MD

## 2020-12-24 ENCOUNTER — Other Ambulatory Visit (HOSPITAL_COMMUNITY): Payer: Self-pay | Admitting: Urology

## 2021-01-14 IMAGING — CT CT MAXILLOFACIAL W/O CM
1 series · 14 of 30 positions shown, 18 images · non-contrast
Comparison: [facility] radiographs 12/31/2014.

CLINICAL DATA: 38-year-old male with maxillofacial pain. Nasal
congestion. Deviated septum, chronic sinusitis.

EXAM:
CT MAXILLOFACIAL WITHOUT CONTRAST
TECHNIQUE: Multidetector CT images of the paranasal sinuses were obtained using
the standard protocol without intravenous contrast.

[Series 4: soft tissue · axial · 0.41mm/px · z∈[+1031,+1149]mm · 14 of 133 slices shown, 18 images]
[im 10/133  brain]
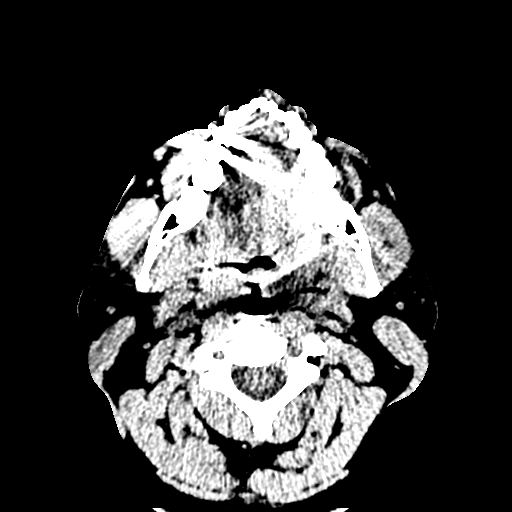
[im 10/133  bone]
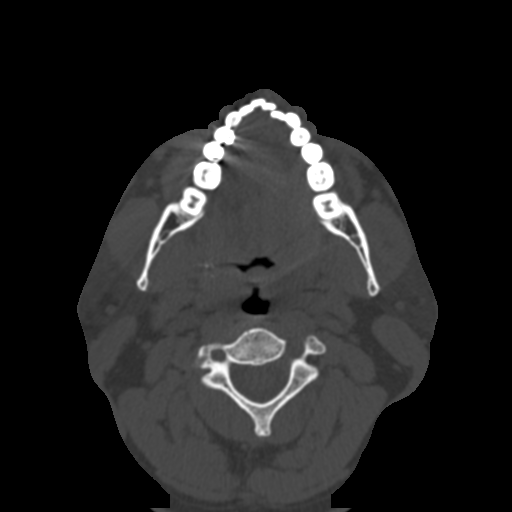
[im 19/133  bone]
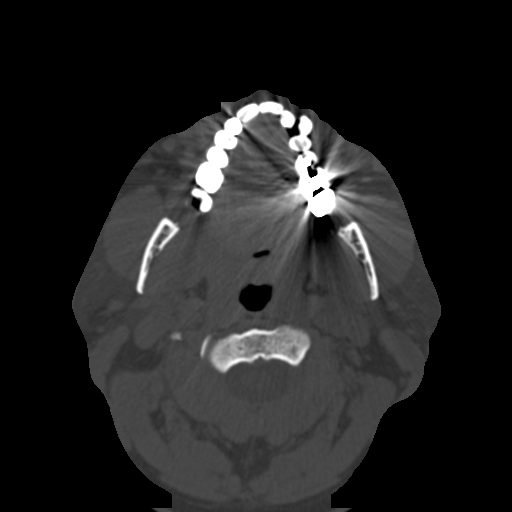
[im 28/133  bone]
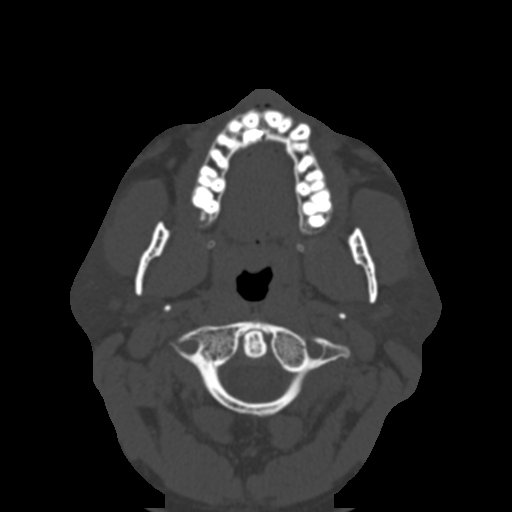
[im 37/133  bone]
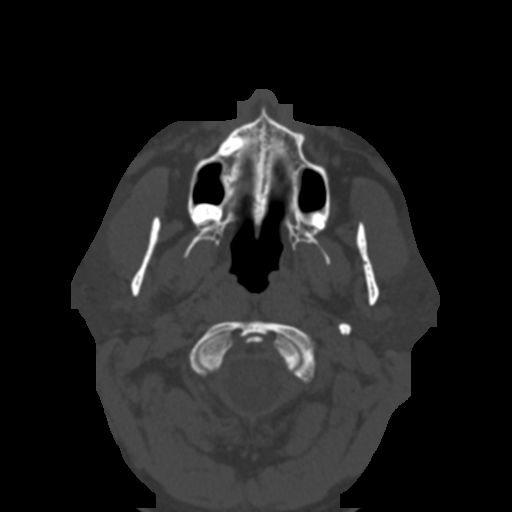
[im 46/133  brain]
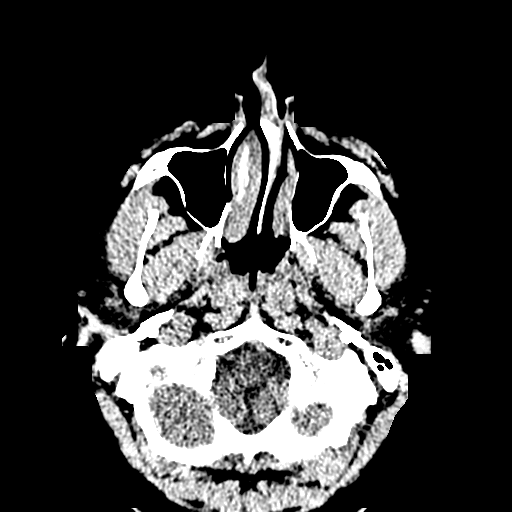
[im 46/133  bone]
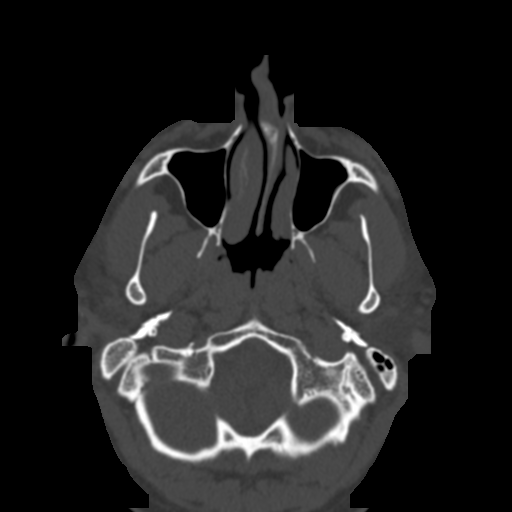
[im 55/133  bone]
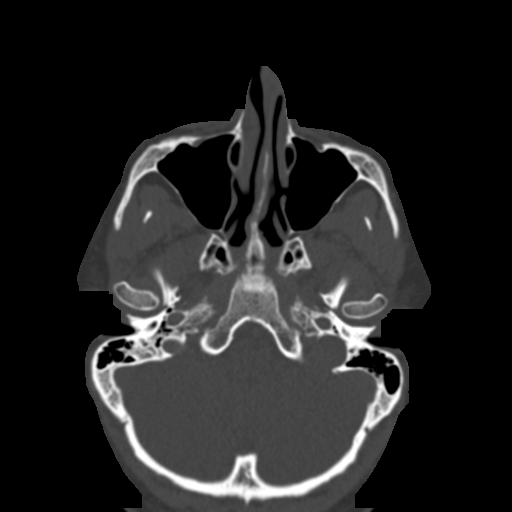
[im 64/133  bone]
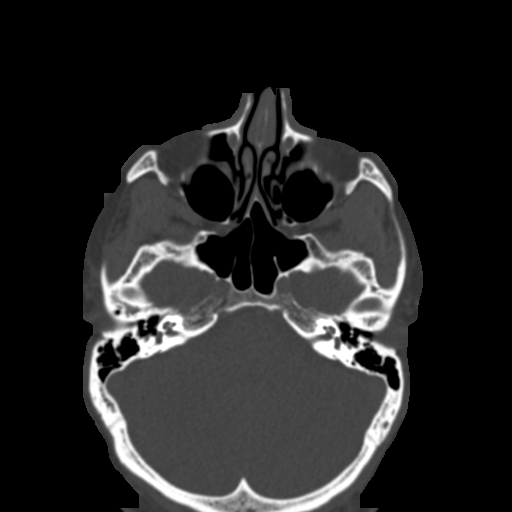
[im 73/133  bone]
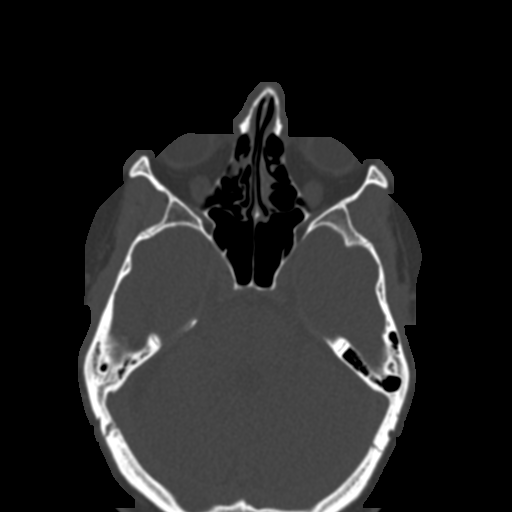
[im 82/133  brain]
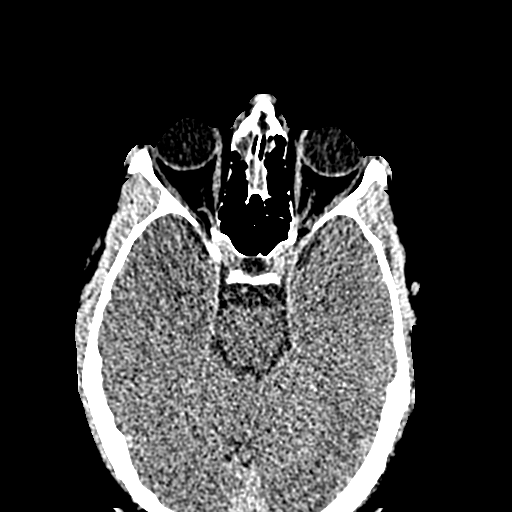
[im 82/133  bone]
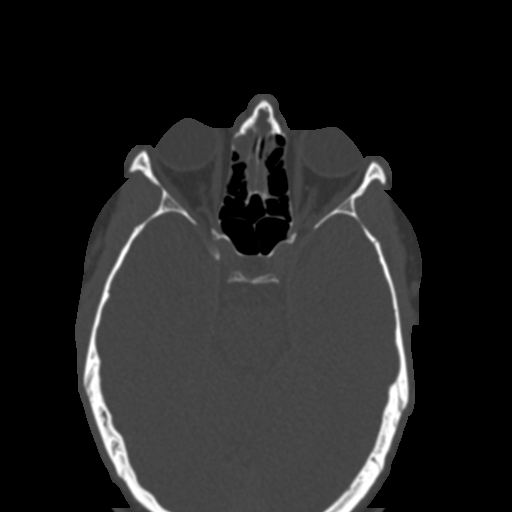
[im 92/133  bone]
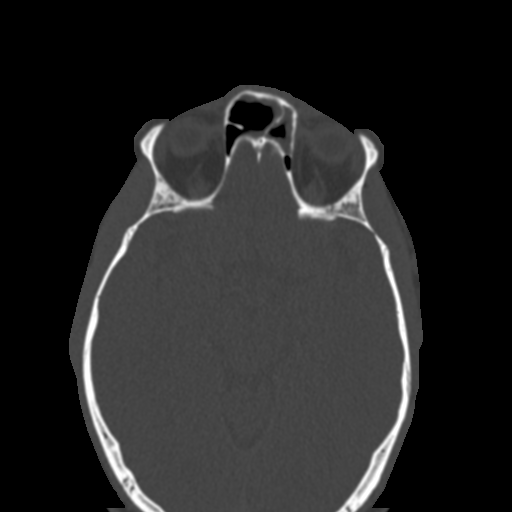
[im 101/133  bone]
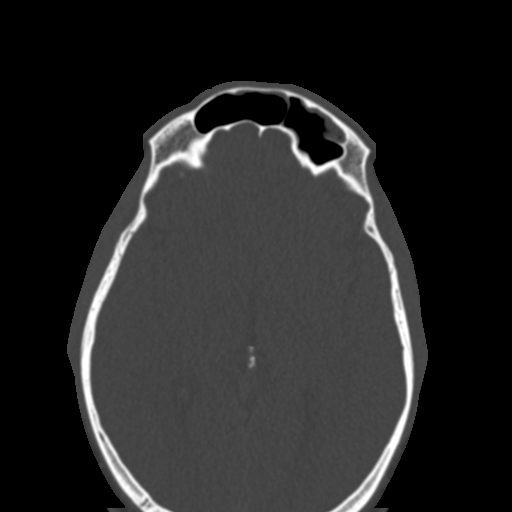
[im 110/133  bone]
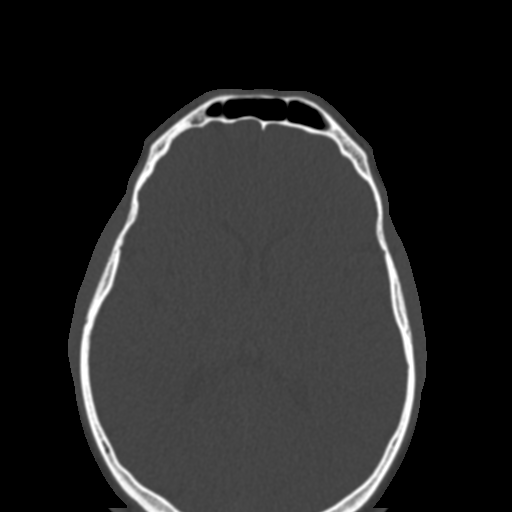
[im 119/133  brain]
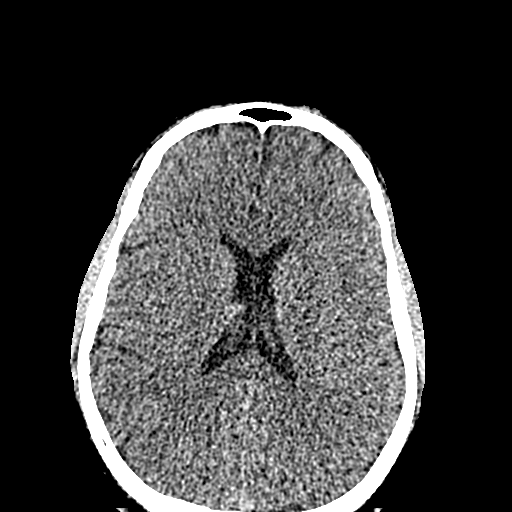
[im 119/133  bone]
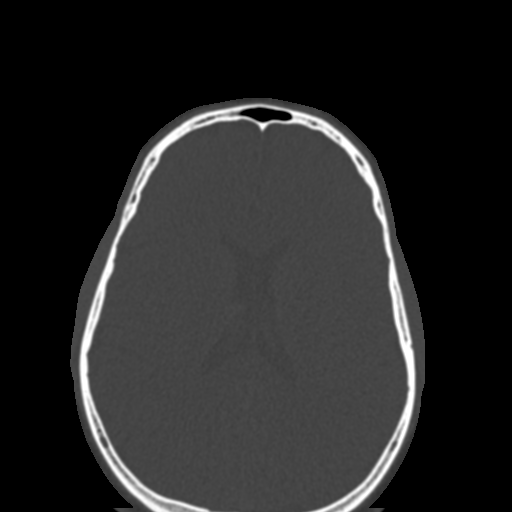
[im 128/133  bone]
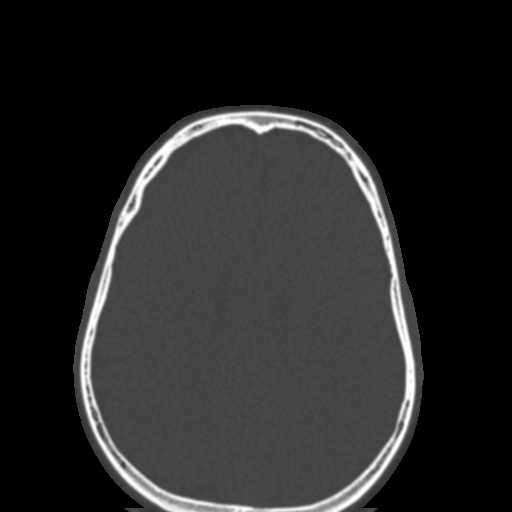

[14 of 30 positions shown; findings below may reference images not displayed]

FINDINGS: Paranasal sinuses:

Frontal: Mildly hyperplastic. There is mild to moderate mostly
inferior bilateral frontal sinus mucosal thickening (series 3, image
98 and coronal image 24) affecting both frontoethmoidal recesses.

Ethmoid: Bilateral anterior ethmoidal mucosal thickening greater on
the right. Mildly hyperplastic ethmoids, otherwise clear.

Maxillary: Hyperplastic with bilateral Haller cells (coronal image
31). But clear; minimal left maxillary mucosal thickening such as on
series 3, image 44.

Sphenoid: Hyperplastic and clear.

Right ostiomeatal unit: Patent (coronal image 31).

Left ostiomeatal unit: Patent (coronal image 30). And there is also
a small accessory left maxillary sinus drainage pathway as seen on
series 3, image 54.

Nasal passages: The nasal septum is intact. There is rightward
anterior but otherwise leftward nasal septal deviation and spurring
(anteriorly coronal image 11 and posteriorly coronal image 26). Well
pneumatized nasal cavity, including both olfactory recesses. No
bubbly opacity.

Anatomy:

Anterior ethmoidal artery position suspected on coronal image 31
with some pneumatization superior to the notch on the left.

Keros type 2 olfactory fossa.

Sellar sphenoid pneumatization pattern.

Uncovering of the bilateral vidian canals (coronal image 49) with no
clinoid process pneumatization.

Bilateral Haller cells (coronal image 31).

Small accessory left maxillary sinus drainage pathway as seen on
series 3, image 54.

Other: Visible noncontrast brain parenchyma appears normal.
Visualized orbits and scalp soft tissues are within normal limits.
Negative visible noncontrast deep soft tissue spaces of the face.

Bilateral tympanic cavities and mastoids are clear. No acute
maxillary dental finding. There is an unerupted or supernumerary
tooth in the right anterior maxilla (series 5, image 26). No acute
osseous abnormality identified.
IMPRESSION: 1. Hyperplastic and most well pneumatized paranasal sinuses. Up to
moderate bilateral frontal and anterior ethmoidal mucosal
thickening.

2. Rightward anterior but leftward posterior nasal septal deviation
and spurring.

3. Note sinus Anatomy, detailed above.

## 2021-02-10 ENCOUNTER — Other Ambulatory Visit: Payer: Self-pay

## 2021-02-11 ENCOUNTER — Ambulatory Visit (INDEPENDENT_AMBULATORY_CARE_PROVIDER_SITE_OTHER): Payer: No Typology Code available for payment source | Admitting: Internal Medicine

## 2021-02-11 ENCOUNTER — Encounter: Payer: Self-pay | Admitting: Internal Medicine

## 2021-02-11 VITALS — BP 118/74 | HR 49 | Temp 98.1°F | Ht 64.0 in | Wt 185.2 lb

## 2021-02-11 DIAGNOSIS — R001 Bradycardia, unspecified: Secondary | ICD-10-CM | POA: Diagnosis not present

## 2021-02-11 DIAGNOSIS — E785 Hyperlipidemia, unspecified: Secondary | ICD-10-CM | POA: Diagnosis not present

## 2021-02-11 DIAGNOSIS — Z0001 Encounter for general adult medical examination with abnormal findings: Secondary | ICD-10-CM

## 2021-02-11 DIAGNOSIS — Z1159 Encounter for screening for other viral diseases: Secondary | ICD-10-CM

## 2021-02-11 DIAGNOSIS — E538 Deficiency of other specified B group vitamins: Secondary | ICD-10-CM | POA: Diagnosis not present

## 2021-02-11 DIAGNOSIS — Z0000 Encounter for general adult medical examination without abnormal findings: Secondary | ICD-10-CM

## 2021-02-11 DIAGNOSIS — E559 Vitamin D deficiency, unspecified: Secondary | ICD-10-CM

## 2021-02-11 LAB — CBC WITH DIFFERENTIAL/PLATELET
Basophils Absolute: 0 10*3/uL (ref 0.0–0.1)
Basophils Relative: 0.5 % (ref 0.0–3.0)
Eosinophils Absolute: 0.1 10*3/uL (ref 0.0–0.7)
Eosinophils Relative: 2.2 % (ref 0.0–5.0)
HCT: 46.4 % (ref 39.0–52.0)
Hemoglobin: 16.1 g/dL (ref 13.0–17.0)
Lymphocytes Relative: 38.1 % (ref 12.0–46.0)
Lymphs Abs: 2.4 10*3/uL (ref 0.7–4.0)
MCHC: 34.6 g/dL (ref 30.0–36.0)
MCV: 90.3 fl (ref 78.0–100.0)
Monocytes Absolute: 0.4 10*3/uL (ref 0.1–1.0)
Monocytes Relative: 6.2 % (ref 3.0–12.0)
Neutro Abs: 3.3 10*3/uL (ref 1.4–7.7)
Neutrophils Relative %: 53 % (ref 43.0–77.0)
Platelets: 205 10*3/uL (ref 150.0–400.0)
RBC: 5.14 Mil/uL (ref 4.22–5.81)
RDW: 15.3 % (ref 11.5–15.5)
WBC: 6.3 10*3/uL (ref 4.0–10.5)

## 2021-02-11 LAB — HEPATIC FUNCTION PANEL
ALT: 49 U/L (ref 0–53)
AST: 26 U/L (ref 0–37)
Albumin: 4.5 g/dL (ref 3.5–5.2)
Alkaline Phosphatase: 53 U/L (ref 39–117)
Bilirubin, Direct: 0.2 mg/dL (ref 0.0–0.3)
Total Bilirubin: 1.1 mg/dL (ref 0.2–1.2)
Total Protein: 7.6 g/dL (ref 6.0–8.3)

## 2021-02-11 LAB — VITAMIN B12: Vitamin B-12: 280 pg/mL (ref 211–911)

## 2021-02-11 LAB — LIPID PANEL
Cholesterol: 186 mg/dL (ref 0–200)
HDL: 32.7 mg/dL — ABNORMAL LOW (ref >39.00)
LDL Cholesterol: 117 mg/dL — ABNORMAL HIGH (ref 0–99)
NonHDL: 152.99
Total CHOL/HDL Ratio: 6
Triglycerides: 181 mg/dL — ABNORMAL HIGH (ref 0.0–149.0)
VLDL: 36.2 mg/dL (ref 0.0–40.0)

## 2021-02-11 LAB — URINALYSIS, ROUTINE W REFLEX MICROSCOPIC
Bilirubin Urine: NEGATIVE
Hgb urine dipstick: NEGATIVE
Ketones, ur: NEGATIVE
Leukocytes,Ua: NEGATIVE
Nitrite: NEGATIVE
RBC / HPF: NONE SEEN (ref 0–?)
Specific Gravity, Urine: 1.015 (ref 1.000–1.030)
Total Protein, Urine: NEGATIVE
Urine Glucose: NEGATIVE
Urobilinogen, UA: 0.2 (ref 0.0–1.0)
WBC, UA: NONE SEEN (ref 0–?)
pH: 7 (ref 5.0–8.0)

## 2021-02-11 LAB — BASIC METABOLIC PANEL
BUN: 15 mg/dL (ref 6–23)
CO2: 27 mEq/L (ref 19–32)
Calcium: 9.7 mg/dL (ref 8.4–10.5)
Chloride: 104 mEq/L (ref 96–112)
Creatinine, Ser: 1.22 mg/dL (ref 0.40–1.50)
GFR: 75.69 mL/min (ref >60.00)
Glucose, Bld: 92 mg/dL (ref 70–99)
Potassium: 4.2 mEq/L (ref 3.5–5.1)
Sodium: 139 mEq/L (ref 135–145)

## 2021-02-11 LAB — VITAMIN D 25 HYDROXY (VIT D DEFICIENCY, FRACTURES): VITD: 26.01 ng/mL — ABNORMAL LOW (ref 30.00–100.00)

## 2021-02-11 LAB — TSH: TSH: 1.76 u[IU]/mL (ref 0.35–4.50)

## 2021-02-11 NOTE — Patient Instructions (Signed)
Please continue to follow your low heart rate at home  [facility] to stop the atorvastatin for now  Please take OTC Vitamin D3 at 2000 units per day, indefinitely.  Please continue all other medications as before, and refills have been done if requested.  Please have the pharmacy call with any other refills you may need.  Please continue your efforts at being more active, low cholesterol diet, and weight control.  You are otherwise up to date with prevention measures today.  Please keep your appointments with your specialists as you may have planned  Please go to the LAB at the blood drawing area for the tests to be done  You will be contacted by phone if any changes need to be made immediately.  Otherwise, you will receive a letter about your results with an explanation, but please check with MyChart first.  Please remember to sign up for MyChart if you have not done so, as this will be important to you in the future with finding out test results, communicating by private email, and scheduling acute appointments online when needed.  Please make an Appointment to return for your 1 year visit, or sooner if needed

## 2021-02-11 NOTE — Progress Notes (Signed)
Patient ID: Donald Swanson, male   DOB: 15-Oct-1980, 40 y.o.   MRN: [id number]         Chief Complaint:: wellness exam and low pulse, hld, low vitd       HPI:  Donald Swanson is a 40 y.o. male here for wellness exam; due for hep c screen, o.w up to date with preventive referrals and immunizations                        Also Pt denies chest pain, increased sob or doe, wheezing, orthopnea, PND, increased LE swelling, palpitations, dizziness or syncope.  Has myalgias with lipitor so asks to stop.  Does not want to consider other statin for now.  Not taking Vit D.   Pt denies polydipsia, polyuria, or newe focal neuro s/s.   Pt denies fever, wt loss, night sweats, loss of appetite, or other constitutional symptoms  No other new complaints    Wt Readings from Last 3 Encounters:  02/11/21 185 lb 3.2 oz (84 kg)  07/09/20 177 lb 14.6 oz (80.7 kg)  12/06/19 183 lb 12.8 oz (83.4 kg)   BP Readings from Last 3 Encounters:  02/11/21 118/74  07/09/20 (!) 144/99  12/06/19 124/80   Immunization History  Administered Date(s) Administered  . Influenza,inj,Quad PF,6+ Mos 07/02/2016  . Influenza-Unspecified 07/12/2017, 08/12/2018, 07/13/2019, 08/08/2020  . PFIZER(Purple Top)SARS-COV-2 Vaccination 05/16/2020, 06/06/2020  . Tdap 02/23/2013   There are no preventive care reminders to display for this patient.    Past Medical History:  Diagnosis Date  . Hyperlipemia   . Low testosterone    Past Surgical History:  Procedure Laterality Date  . ETHMOIDECTOMY Bilateral 07/09/2020   Procedure: TOTAL ETHMOIDECTOMY;  Surgeon: Drema Halon, MD;  Location: [facility];  Service: ENT;  Laterality: Bilateral;  . KNEE ARTHROSCOPY WITH MEDIAL MENISECTOMY Right 06/10/2015   Procedure: knee arthroscopy with medial menisectomy  ;  Surgeon: Gean Birchwood, MD;  Location: [facility];  Service: Orthopedics;  Laterality: Right;  . MAXILLARY ANTROSTOMY Bilateral 07/09/2020    Procedure: ENDOSCOPIC MAXILLARY ANTROSTOMY;  Surgeon: Drema Halon, MD;  Location: [facility];  Service: ENT;  Laterality: Bilateral;  . NASAL SEPTOPLASTY W/ TURBINOPLASTY Bilateral 07/09/2020   Procedure: NASAL SEPTOPLASTY WITH TURBINATE REDUCTION;  Surgeon: Drema Halon, MD;  Location: [facility];  Service: ENT;  Laterality: Bilateral;  . NASAL SINUS SURGERY Bilateral 07/09/2020   Procedure: ENDOSCOPIC SINUS WITH BALLOON DILATION FRONTAL OSTIUM;  Surgeon: Drema Halon, MD;  Location: [facility];  Service: ENT;  Laterality: Bilateral;  . SINUS ENDO WITH FUSION Bilateral 07/09/2020   Procedure: SINUS ENDOSCOPY WITH FUSION NAVIGATION;  Surgeon: Drema Halon, MD;  Location: [facility];  Service: ENT;  Laterality: Bilateral;    reports that he has quit smoking. His smoking use included cigarettes. He has a 2.25 pack-year smoking history. He has never used smokeless tobacco. He reports current alcohol use. He reports that he does not use drugs. family history includes COPD in his mother. No Known Allergies Current Outpatient Medications on File Prior to Visit  Medication Sig Dispense Refill  . dicyclomine (BENTYL) 10 MG capsule TAKE 1 CAPSULE BY MOUTH 4 TIMES DAILY - BEFORE MEALS AND AT BEDTIME. 120 capsule 5  . testosterone cypionate (DEPOTESTOSTERONE CYPIONATE) 200 MG/ML injection Inject into the muscle once a week.    . testosterone cypionate (DEPOTESTOTERONE CYPIONATE) 100 MG/ML  injection INJECT 0.5 MG INTO THE MUSCLE EVERY 7 DAYS 10 mL 1  . testosterone cypionate (DEPOTESTOTERONE CYPIONATE) 100 MG/ML injection INJECT 0.5 ML INTO THE MUSCLE EVERY 7 DAYS *DISCARD THE BOTTLE AFTER 28 DAYS 10 mL 1   No current facility-administered medications on file prior to visit.        ROS:  All others reviewed and negative.  Objective        PE:  BP 118/74 (BP Location: Right Arm, Patient Position: Sitting, Cuff  Size: Large)   Pulse (!) 49   Temp 98.1 F (36.7 C) (Oral)   Ht 5\' 4"  (1.626 m)   Wt 185 lb 3.2 oz (84 kg)   SpO2 96%   BMI 31.79 kg/m                 Constitutional: Pt appears in NAD               HENT: Head: NCAT.                Right Ear: External ear normal.                 Left Ear: External ear normal.                Eyes: . Pupils are equal, round, and reactive to light. Conjunctivae and EOM are normal               Nose: without d/c or deformity               Neck: Neck supple. Gross normal ROM               Cardiovascular: Normal rate and regular rhythm.                 Pulmonary/Chest: Effort normal and breath sounds without rales or wheezing.                Abd:  Soft, NT, ND, + BS, no organomegaly               Neurological: Pt is alert. At baseline orientation, motor grossly intact               Skin: Skin is warm. No rashes, no other new lesions, LE edema - none               Psychiatric: Pt behavior is normal without agitation   Micro: none  Cardiac tracings I have personally interpreted today:  none  Pertinent Radiological findings (summarize): none   Lab Results  Component Value Date   WBC 6.3 02/11/2021   HGB 16.1 02/11/2021   HCT 46.4 02/11/2021   PLT 205.0 02/11/2021   GLUCOSE 92 02/11/2021   CHOL 186 02/11/2021   TRIG 181.0 (H) 02/11/2021   HDL 32.70 (L) 02/11/2021   LDLDIRECT 137.0 10/13/2017   LDLCALC 117 (H) 02/11/2021   ALT 49 02/11/2021   AST 26 02/11/2021   NA 139 02/11/2021   K 4.2 02/11/2021   CL 104 02/11/2021   CREATININE 1.22 02/11/2021   BUN 15 02/11/2021   CO2 27 02/11/2021   TSH 1.76 02/11/2021   PSA 0.68 05/19/2016   Assessment/Plan:  Donald Swanson is a 40 y.o. White or Caucasian [1] male with  has a past medical history of Hyperlipemia and Low testosterone.  Encounter for well adult exam with abnormal findings Age and sex appropriate education and counseling updated with regular exercise and diet Referrals  for  preventative services - due for hep c screen Immunizations addressed - none needed Smoking counseling  - none needed Evidence for depression or other mood disorder - none significant Most recent labs reviewed. I have personally reviewed and have noted: 1) the patient's medical and social history 2) The patient's current medications and supplements 3) The patient's height, weight, and BMI have been recorded in the chart   Vitamin D deficiency Last vitamin D Lab Results  Component Value Date   VD25OH 26.01 (L) 02/11/2021   Low, to start oral replacement   Hyperlipidemia Lab Results  Component Value Date   LDLCALC 117 (H) 02/11/2021   Stable, pt to continue current low chol diet - declines statin   Bradycardia Stable, asympt, to continue to monitor at home as well  Followup: Return in about 1 year (around 02/11/2022).  Oliver Barre, MD 02/18/2021 11:25 PM [facility]

## 2021-02-12 LAB — HEPATITIS C ANTIBODY
Hepatitis C Ab: NONREACTIVE
SIGNAL TO CUT-OFF: 0.08 (ref <1.00)

## 2021-02-18 ENCOUNTER — Encounter: Payer: Self-pay | Admitting: Internal Medicine

## 2021-02-18 DIAGNOSIS — E559 Vitamin D deficiency, unspecified: Secondary | ICD-10-CM | POA: Insufficient documentation

## 2021-02-18 DIAGNOSIS — R001 Bradycardia, unspecified: Secondary | ICD-10-CM | POA: Insufficient documentation

## 2021-02-18 NOTE — Assessment & Plan Note (Signed)
Last vitamin D Lab Results  Component Value Date   VD25OH 26.01 (L) 02/11/2021   Low, to start oral replacement  

## 2021-02-18 NOTE — Assessment & Plan Note (Signed)
Lab Results  Component Value Date   LDLCALC 117 (H) 02/11/2021   Stable, pt to continue current low chol diet - declines statin

## 2021-02-18 NOTE — Assessment & Plan Note (Signed)
Stable, asympt, to continue to monitor at home as well

## 2021-02-18 NOTE — Assessment & Plan Note (Signed)
Age and sex appropriate education and counseling updated with regular exercise and diet Referrals for preventative services - due for hep c screen Immunizations addressed - none needed Smoking counseling  - none needed Evidence for depression or other mood disorder - none significant Most recent labs reviewed. I have personally reviewed and have noted: 1) the patient's medical and social history 2) The patient's current medications and supplements 3) The patient's height, weight, and BMI have been recorded in the chart

## 2021-03-31 ENCOUNTER — Encounter: Payer: Self-pay | Admitting: Internal Medicine

## 2021-04-04 ENCOUNTER — Other Ambulatory Visit: Payer: Self-pay

## 2021-04-04 ENCOUNTER — Encounter: Payer: Self-pay | Admitting: Internal Medicine

## 2021-04-04 ENCOUNTER — Other Ambulatory Visit (HOSPITAL_BASED_OUTPATIENT_CLINIC_OR_DEPARTMENT_OTHER): Payer: Self-pay

## 2021-04-04 ENCOUNTER — Ambulatory Visit (INDEPENDENT_AMBULATORY_CARE_PROVIDER_SITE_OTHER): Payer: No Typology Code available for payment source | Admitting: Internal Medicine

## 2021-04-04 VITALS — BP 122/80 | HR 86 | Temp 98.3°F | Resp 18 | Ht 64.0 in | Wt 186.4 lb

## 2021-04-04 DIAGNOSIS — J309 Allergic rhinitis, unspecified: Secondary | ICD-10-CM | POA: Diagnosis not present

## 2021-04-04 DIAGNOSIS — F32A Depression, unspecified: Secondary | ICD-10-CM | POA: Diagnosis not present

## 2021-04-04 DIAGNOSIS — G43809 Other migraine, not intractable, without status migrainosus: Secondary | ICD-10-CM

## 2021-04-04 DIAGNOSIS — F419 Anxiety disorder, unspecified: Secondary | ICD-10-CM | POA: Diagnosis not present

## 2021-04-04 DIAGNOSIS — G43909 Migraine, unspecified, not intractable, without status migrainosus: Secondary | ICD-10-CM | POA: Insufficient documentation

## 2021-04-04 MED ORDER — RIZATRIPTAN BENZOATE 10 MG PO TBDP
10.0000 mg | ORAL_TABLET | ORAL | 11 refills | Status: DC | PRN
  Filled 2021-04-04: qty 10, 17d supply, fill #0

## 2021-04-04 MED ORDER — CITALOPRAM HYDROBROMIDE 20 MG PO TABS
20.0000 mg | ORAL_TABLET | Freq: Every day | ORAL | 3 refills | Status: DC
  Filled 2021-04-04: qty 90, 90d supply, fill #0

## 2021-04-04 NOTE — Progress Notes (Signed)
Patient ID: Donald Swanson, male   DOB: 03-Oct-1981, 40 y.o.   MRN: [id number]        Chief Complaint: HA, stress, allergies       HPI:  Donald Swanson is a 40 y.o. male here with c/o worsening frequency and severity of HA mod to severe now 2-3 times per wk, bifrontal HA with throbbing, photo and phonophobia and nausea,, but has been able to tough them out without missed work; often ibuprofen 800 mg helps to some degree.  Has had increased stress recently with a new job, hard to quantify but has been struggling with this. Denies worsening depressive symptoms, suicidal ideation, or panic; has ongoing anxiety, wife also with frequent migraines as well on aimovig but he does not want this for now.  .Pt denies chest pain, increased sob or doe, wheezing, orthopnea, PND, increased LE swelling, palpitations, dizziness or syncope.   Pt denies polydipsia, polyuria, or other focal neuro s/s.  Does have several wks ongoing nasal allergy symptoms with clearish congestion, itch and sneezing, without fever, pain, ST, cough, swelling or wheezing.       Wt Readings from Last 3 Encounters:  04/04/21 186 lb 6.4 oz (84.6 kg)  02/11/21 185 lb 3.2 oz (84 kg)  07/09/20 177 lb 14.6 oz (80.7 kg)   BP Readings from Last 3 Encounters:  04/04/21 122/80  02/11/21 118/74  07/09/20 (!) 144/99         Past Medical History:  Diagnosis Date   Hyperlipemia    Low testosterone    Past Surgical History:  Procedure Laterality Date   ETHMOIDECTOMY Bilateral 07/09/2020   Procedure: TOTAL ETHMOIDECTOMY;  Surgeon: Drema Halon, MD;  Location: [facility];  Service: ENT;  Laterality: Bilateral;   KNEE ARTHROSCOPY WITH MEDIAL MENISECTOMY Right 06/10/2015   Procedure: knee arthroscopy with medial menisectomy  ;  Surgeon: Gean Birchwood, MD;  Location: [facility];  Service: Orthopedics;  Laterality: Right;   MAXILLARY ANTROSTOMY Bilateral 07/09/2020   Procedure: ENDOSCOPIC MAXILLARY  ANTROSTOMY;  Surgeon: Drema Halon, MD;  Location: [facility];  Service: ENT;  Laterality: Bilateral;   NASAL SEPTOPLASTY W/ TURBINOPLASTY Bilateral 07/09/2020   Procedure: NASAL SEPTOPLASTY WITH TURBINATE REDUCTION;  Surgeon: Drema Halon, MD;  Location: [facility];  Service: ENT;  Laterality: Bilateral;   NASAL SINUS SURGERY Bilateral 07/09/2020   Procedure: ENDOSCOPIC SINUS WITH BALLOON DILATION FRONTAL OSTIUM;  Surgeon: Drema Halon, MD;  Location: [facility];  Service: ENT;  Laterality: Bilateral;   SINUS ENDO WITH FUSION Bilateral 07/09/2020   Procedure: SINUS ENDOSCOPY WITH FUSION NAVIGATION;  Surgeon: Drema Halon, MD;  Location: [facility];  Service: ENT;  Laterality: Bilateral;    reports that he has quit smoking. His smoking use included cigarettes. He has a 2.25 pack-year smoking history. He has never used smokeless tobacco. He reports current alcohol use. He reports that he does not use drugs. family history includes COPD in his mother. No Known Allergies Current Outpatient Medications on File Prior to Visit  Medication Sig Dispense Refill   dicyclomine (BENTYL) 10 MG capsule TAKE 1 CAPSULE BY MOUTH 4 TIMES DAILY - BEFORE MEALS AND AT BEDTIME. 120 capsule 5   testosterone cypionate (DEPOTESTOSTERONE CYPIONATE) 200 MG/ML injection Inject into the muscle once a week.     testosterone cypionate (DEPOTESTOTERONE CYPIONATE) 100 MG/ML injection INJECT 0.5 MG INTO THE MUSCLE EVERY 7 DAYS 10 mL 1  testosterone cypionate (DEPOTESTOTERONE CYPIONATE) 100 MG/ML injection INJECT 0.5 ML INTO THE MUSCLE EVERY 7 DAYS *DISCARD THE BOTTLE AFTER 28 DAYS 10 mL 1   No current facility-administered medications on file prior to visit.        ROS:  All others reviewed and negative.  Objective        PE:  BP 122/80   Pulse 86   Temp 98.3 F (36.8 C) (Oral)   Resp 18   Ht 5\' 4"  (1.626 m)   Wt 186 lb 6.4 oz  (84.6 kg)   SpO2 96%   BMI 32.00 kg/m                 Constitutional: Pt appears in NAD               HENT: Head: NCAT.                Right Ear: External ear normal.                 Left Ear: External ear normal.                Eyes: . Pupils are equal, round, and reactive to light. Conjunctivae and EOM are normal               Nose: without d/c or deformity               Neck: Neck supple. Gross normal ROM               Cardiovascular: Normal rate and regular rhythm.                 Pulmonary/Chest: Effort normal and breath sounds without rales or wheezing.                Abd:  Soft, NT, ND, + BS, no organomegaly               Neurological: Pt is alert. At baseline orientation, motor grossly intact               Skin: Skin is warm. No rashes, no other new lesions, LE edema - none               Psychiatric: Pt behavior is normal without agitation   Micro: none  Cardiac tracings I have personally interpreted today:  none  Pertinent Radiological findings (summarize): none   Lab Results  Component Value Date   WBC 6.3 02/11/2021   HGB 16.1 02/11/2021   HCT 46.4 02/11/2021   PLT 205.0 02/11/2021   GLUCOSE 92 02/11/2021   CHOL 186 02/11/2021   TRIG 181.0 (H) 02/11/2021   HDL 32.70 (L) 02/11/2021   LDLDIRECT 137.0 10/13/2017   LDLCALC 117 (H) 02/11/2021   ALT 49 02/11/2021   AST 26 02/11/2021   NA 139 02/11/2021   K 4.2 02/11/2021   CL 104 02/11/2021   CREATININE 1.22 02/11/2021   BUN 15 02/11/2021   CO2 27 02/11/2021   TSH 1.76 02/11/2021   PSA 0.68 05/19/2016   Assessment/Plan:  Donald Swanson is a 40 y.o. White or Caucasian [1] male with  has a past medical history of Hyperlipemia and Low testosterone.  Migraine headache At lesat moderate severity with increased freqency and severity recenlty, for maxalt mlt prn with ibuprofen prn,  to f/u any worsening symptoms or concerns, consdier add preventive and/or neurology referral if not improved  Allergic  rhinitis Mild to mod uncontrolled,  not related to migraine, for allegra prn, declines nasacort due to extensive hx of prior sinus issues and surgury, to f/u any worsening symptoms or concerns  Anxiety and depression Mild to mod, no SI or HI, for celexa 20 qd,  to f/u any worsening symptoms or concerns  Followup: No follow-ups on file.  Oliver Barre, MD 04/04/2021 8:48 PM [facility]

## 2021-04-04 NOTE — Assessment & Plan Note (Addendum)
At lesat moderate severity with increased freqency and severity recenlty, for maxalt mlt prn with ibuprofen prn,  to f/u any worsening symptoms or concerns, consdier add preventive and/or neurology referral if not improved

## 2021-04-04 NOTE — Patient Instructions (Signed)
Please take all new medication as prescribed - the maxalt as needed, and the celexa for nerves and stress overall  Please also consider taking allegra as needed for allergies  Please continue all other medications as before, and refills have been done if requested.  Please have the pharmacy call with any other refills you may need.  Please keep your appointments with your specialists as you may have planned  Please call for neurology referral if you need this

## 2021-04-04 NOTE — Assessment & Plan Note (Signed)
Mild to mod, no SI or HI, for celexa 20 qd,  to f/u any worsening symptoms or concerns 

## 2021-04-04 NOTE — Assessment & Plan Note (Signed)
Mild to mod uncontrolled, not related to migraine, for allegra prn, declines nasacort due to extensive hx of prior sinus issues and surgury, to f/u any worsening symptoms or concerns

## 2021-04-07 ENCOUNTER — Other Ambulatory Visit (HOSPITAL_BASED_OUTPATIENT_CLINIC_OR_DEPARTMENT_OTHER): Payer: Self-pay

## 2021-07-28 ENCOUNTER — Other Ambulatory Visit (HOSPITAL_BASED_OUTPATIENT_CLINIC_OR_DEPARTMENT_OTHER): Payer: Self-pay

## 2021-07-28 MED ORDER — TESTOSTERONE CYPIONATE 100 MG/ML IM SOLN
INTRAMUSCULAR | 1 refills | Status: DC
Start: 2021-07-28 — End: 2022-04-20
  Filled 2021-07-28: qty 10, 28d supply, fill #0

## 2021-11-11 ENCOUNTER — Other Ambulatory Visit (HOSPITAL_BASED_OUTPATIENT_CLINIC_OR_DEPARTMENT_OTHER): Payer: Self-pay

## 2021-11-11 ENCOUNTER — Telehealth: Payer: No Typology Code available for payment source | Admitting: Family Medicine

## 2021-11-11 DIAGNOSIS — J069 Acute upper respiratory infection, unspecified: Secondary | ICD-10-CM

## 2021-11-11 MED ORDER — BENZONATATE 100 MG PO CAPS
100.0000 mg | ORAL_CAPSULE | Freq: Two times a day (BID) | ORAL | 0 refills | Status: DC | PRN
  Filled 2021-11-11: qty 20, 10d supply, fill #0

## 2021-11-11 MED ORDER — FLUTICASONE PROPIONATE 50 MCG/ACT NA SUSP
2.0000 | Freq: Every day | NASAL | 0 refills | Status: DC
  Filled 2021-11-11: qty 16, 30d supply, fill #0

## 2021-11-11 NOTE — Progress Notes (Signed)

## 2022-04-20 ENCOUNTER — Encounter: Payer: Self-pay | Admitting: Emergency Medicine

## 2022-04-20 ENCOUNTER — Other Ambulatory Visit (HOSPITAL_BASED_OUTPATIENT_CLINIC_OR_DEPARTMENT_OTHER): Payer: Self-pay

## 2022-04-20 ENCOUNTER — Other Ambulatory Visit: Payer: Self-pay

## 2022-04-20 ENCOUNTER — Emergency Department
Admission: EM | Admit: 2022-04-20 | Discharge: 2022-04-20 | Disposition: A | Discharge status: Home / Self Care | Payer: No Typology Code available for payment source | Source: Home / Self Care

## 2022-04-20 DIAGNOSIS — R21 Rash and other nonspecific skin eruption: Secondary | ICD-10-CM | POA: Diagnosis not present

## 2022-04-20 MED ORDER — METHYLPREDNISOLONE SODIUM SUCC 125 MG IJ SOLR
125.0000 mg | Freq: Once | INTRAMUSCULAR | Status: AC
  Administered 2022-04-20: 125 mg via INTRAMUSCULAR

## 2022-04-20 MED ORDER — PREDNISONE 10 MG PO TABS
ORAL_TABLET | Freq: Every day | ORAL | 0 refills | Status: DC
  Filled 2022-04-20: qty 42, 12d supply, fill #0

## 2022-04-20 NOTE — Discharge Instructions (Addendum)
Advised patient to take medication as directed with food to completion.  Instructed patient to start Sterapred Unipak tomorrow morning, Tuesday, 04/21/2022.  Encouraged patient to increase daily water intake while taking this medication.  Advised patient if symptoms worsen and/or unresolved please follow-up with PCP or here for further evaluation.

## 2022-04-20 NOTE — ED Triage Notes (Signed)
Hands , trunk and bottom of feet are itching x 3 weeks intermittently

## 2022-04-20 NOTE — ED Provider Notes (Signed)
[facility]    CSN: [id number] Arrival date & time: 04/20/22  1358      History   Chief Complaint Chief Complaint  Patient presents with   Rash    HPI Donald Swanson is a 41 y.o. male.   HPI Pleasant 41 year old male presents with pruritic rash hands, trunk and bottom of feet for 3 weeks.  PMH significant for migraine headache, rash, and vitamin D deficiency.  Past Medical History:  Diagnosis Date   Hyperlipemia    Low testosterone     Patient Active Problem List   Diagnosis Date Noted   Migraine headache 04/04/2021   Vitamin D deficiency 02/18/2021   Bradycardia 02/18/2021   Anxiety and depression 10/18/2018   Rash 04/11/2018   Allergic rhinitis 01/27/2018   Irritable bowel syndrome (IBS) 01/27/2018   Fatigue 10/13/2017   Sleep disturbance 10/13/2017   Snoring 10/13/2017   Deviated nasal septum 10/13/2017   Chronic tonsillitis 10/13/2017   Polycythemia 10/13/2017   Encounter for well adult exam with abnormal findings 05/14/2016   Left carpal tunnel syndrome 03/10/2016   Low testosterone 12/17/2015   Hyperlipidemia 12/17/2015   Sinusitis, acute 12/17/2015    Past Surgical History:  Procedure Laterality Date   ETHMOIDECTOMY Bilateral 07/09/2020   Procedure: TOTAL ETHMOIDECTOMY;  Surgeon: Drema Halon, MD;  Location: [facility];  Service: ENT;  Laterality: Bilateral;   KNEE ARTHROSCOPY WITH MEDIAL MENISECTOMY Right 06/10/2015   Procedure: knee arthroscopy with medial menisectomy  ;  Surgeon: Gean Birchwood, MD;  Location: [facility];  Service: Orthopedics;  Laterality: Right;   MAXILLARY ANTROSTOMY Bilateral 07/09/2020   Procedure: ENDOSCOPIC MAXILLARY ANTROSTOMY;  Surgeon: Drema Halon, MD;  Location: [facility];  Service: ENT;  Laterality: Bilateral;   NASAL SEPTOPLASTY W/ TURBINOPLASTY Bilateral 07/09/2020   Procedure: NASAL SEPTOPLASTY WITH TURBINATE REDUCTION;  Surgeon: Drema Halon, MD;  Location: [facility];  Service: ENT;  Laterality: Bilateral;   NASAL SINUS SURGERY Bilateral 07/09/2020   Procedure: ENDOSCOPIC SINUS WITH BALLOON DILATION FRONTAL OSTIUM;  Surgeon: Drema Halon, MD;  Location: [facility];  Service: ENT;  Laterality: Bilateral;   SINUS ENDO WITH FUSION Bilateral 07/09/2020   Procedure: SINUS ENDOSCOPY WITH FUSION NAVIGATION;  Surgeon: Drema Halon, MD;  Location: [facility];  Service: ENT;  Laterality: Bilateral;       Home Medications    Prior to Admission medications   Medication Sig Start Date End Date Taking? Authorizing Provider  predniSONE (DELTASONE) 10 MG tablet Take by mouth daily. Take 6 tabs by mouth daily  for 2 days, then 5 tabs for 2 days, then 4 tabs for 2 days, then 3 tabs for 2 days, 2 tabs for 2 days, then 1 tab by mouth daily for 2 days 04/20/22  Yes Trevor Iha, FNP    Family History Family History  Problem Relation Age of Onset   COPD Mother     Social History Social History   Tobacco Use   Smoking status: Former    Packs/day: 0.25    Years: 9.00    Total pack years: 2.25    Types: Cigarettes   Smokeless tobacco: Never  Vaping Use   Vaping Use: Never used  Substance Use Topics   Alcohol use: Yes    Comment: occas   Drug use: No     Allergies   Patient has no known allergies.   Review of Systems Review  of Systems  Skin:  Positive for rash.  All other systems reviewed and are negative.    Physical Exam Triage Vital Signs ED Triage Vitals  Enc Vitals Group     BP      Pulse      Resp      Temp      Temp src      SpO2      Weight      Height      Head Circumference      Peak Flow      Pain Score      Pain Loc      Pain Edu?      Excl. in GC?    No data found.  Updated Vital Signs BP 125/83 (BP Location: Left Arm)   Pulse 67   Temp 98.2 F (36.8 C) (Oral)   Resp 16   Ht 5\' 5"  (1.651 m)   Wt 180 lb (81.6  kg)   SpO2 98%   BMI 29.95 kg/m      Physical Exam Vitals and nursing note reviewed.  Constitutional:      General: He is not in acute distress.    Appearance: Normal appearance. He is normal weight. He is not ill-appearing.  HENT:     Head: Normocephalic and atraumatic.     Mouth/Throat:     Mouth: Mucous membranes are moist.     Pharynx: Oropharynx is clear.  Eyes:     Extraocular Movements: Extraocular movements intact.     Conjunctiva/sclera: Conjunctivae normal.     Pupils: Pupils are equal, round, and reactive to light.  Cardiovascular:     Rate and Rhythm: Normal rate and regular rhythm.     Pulses: Normal pulses.     Heart sounds: Normal heart sounds.  Pulmonary:     Effort: Pulmonary effort is normal.     Breath sounds: Normal breath sounds. No wheezing, rhonchi or rales.  Musculoskeletal:     Cervical back: Normal range of motion and neck supple.  Skin:    General: Skin is warm and dry.     Comments: Torso right-sided/palmar surface of right hand: Pinpoint erythematous pruritic maculopapular eruption, please see image inserted below  Neurological:     General: No focal deficit present.     Mental Status: He is alert and oriented to person, place, and time.         UC Treatments / Results  Labs (all labs ordered are listed, but only abnormal results are displayed) Labs Reviewed - No data to display  EKG   Radiology No results found.  Procedures Procedures (including critical care time)  Medications Ordered in UC Medications  methylPREDNISolone sodium succinate (SOLU-MEDROL) 125 mg/2 mL injection 125 mg (125 mg Intramuscular Given 04/20/22 1509)    Initial Impression / Assessment and Plan / UC Course  I have reviewed the triage vital signs and the nursing notes.  Pertinent labs & imaging results that were available during my care of the patient were reviewed by me and considered in my medical decision making (see chart for details).     MDM:  1.  Rash and nonspecific skin eruption-IM Solu-Medrol 125 mg given once in clinic prior to discharge, Rx'd Sterapred Unipak. Advised patient to take medication as directed with food to completion.  Instructed patient to start Sterapred Unipak tomorrow morning, Tuesday, 04/21/2022.  Encouraged patient to increase daily water intake while taking this medication.  Advised patient if symptoms worsen  and/or unresolved please follow-up with PCP or here for further evaluation.  Patient discharged home, hemodynamically stable.  Final Clinical Impressions(s) / UC Diagnoses   Final diagnoses:  Rash and nonspecific skin eruption     Discharge Instructions      Advised patient to take medication as directed with food to completion.  Instructed patient to start Sterapred Unipak tomorrow morning, Tuesday, 04/21/2022.  Encouraged patient to increase daily water intake while taking this medication.  Advised patient if symptoms worsen and/or unresolved please follow-up with PCP or here for further evaluation.     ED Prescriptions     Medication Sig Dispense Auth. Provider   predniSONE (DELTASONE) 10 MG tablet Take by mouth daily. Take 6 tabs by mouth daily  for 2 days, then 5 tabs for 2 days, then 4 tabs for 2 days, then 3 tabs for 2 days, 2 tabs for 2 days, then 1 tab by mouth daily for 2 days 42 tablet Trevor Iha, FNP      PDMP not reviewed this encounter.   Trevor Iha, FNP 04/20/22 1515

## 2022-05-25 ENCOUNTER — Ambulatory Visit (INDEPENDENT_AMBULATORY_CARE_PROVIDER_SITE_OTHER): Payer: No Typology Code available for payment source | Admitting: Internal Medicine

## 2022-05-25 VITALS — BP 120/72 | HR 50 | Temp 97.9°F | Ht 65.0 in | Wt 182.0 lb

## 2022-05-25 DIAGNOSIS — F32A Depression, unspecified: Secondary | ICD-10-CM

## 2022-05-25 DIAGNOSIS — Z0001 Encounter for general adult medical examination with abnormal findings: Secondary | ICD-10-CM | POA: Diagnosis not present

## 2022-05-25 DIAGNOSIS — E559 Vitamin D deficiency, unspecified: Secondary | ICD-10-CM | POA: Diagnosis not present

## 2022-05-25 DIAGNOSIS — R739 Hyperglycemia, unspecified: Secondary | ICD-10-CM

## 2022-05-25 DIAGNOSIS — E785 Hyperlipidemia, unspecified: Secondary | ICD-10-CM

## 2022-05-25 DIAGNOSIS — R011 Cardiac murmur, unspecified: Secondary | ICD-10-CM

## 2022-05-25 DIAGNOSIS — F419 Anxiety disorder, unspecified: Secondary | ICD-10-CM

## 2022-05-25 DIAGNOSIS — E538 Deficiency of other specified B group vitamins: Secondary | ICD-10-CM

## 2022-05-25 DIAGNOSIS — R7989 Other specified abnormal findings of blood chemistry: Secondary | ICD-10-CM | POA: Diagnosis not present

## 2022-05-25 LAB — URINALYSIS, ROUTINE W REFLEX MICROSCOPIC
Bilirubin Urine: NEGATIVE
Hgb urine dipstick: NEGATIVE
Ketones, ur: NEGATIVE
Leukocytes,Ua: NEGATIVE
Nitrite: NEGATIVE
RBC / HPF: NONE SEEN (ref 0–?)
Specific Gravity, Urine: 1.015 (ref 1.000–1.030)
Total Protein, Urine: NEGATIVE
Urine Glucose: NEGATIVE
Urobilinogen, UA: 0.2 (ref 0.0–1.0)
pH: 7 (ref 5.0–8.0)

## 2022-05-25 LAB — TSH: TSH: 2.34 u[IU]/mL (ref 0.35–5.50)

## 2022-05-25 LAB — HEPATIC FUNCTION PANEL
ALT: 39 U/L (ref 0–53)
AST: 24 U/L (ref 0–37)
Albumin: 4.5 g/dL (ref 3.5–5.2)
Alkaline Phosphatase: 47 U/L (ref 39–117)
Bilirubin, Direct: 0.1 mg/dL (ref 0.0–0.3)
Total Bilirubin: 1 mg/dL (ref 0.2–1.2)
Total Protein: 7.4 g/dL (ref 6.0–8.3)

## 2022-05-25 LAB — CBC WITH DIFFERENTIAL/PLATELET
Basophils Absolute: 0 10*3/uL (ref 0.0–0.1)
Basophils Relative: 0.6 % (ref 0.0–3.0)
Eosinophils Absolute: 0.1 10*3/uL (ref 0.0–0.7)
Eosinophils Relative: 1.6 % (ref 0.0–5.0)
HCT: 45.4 % (ref 39.0–52.0)
Hemoglobin: 15.4 g/dL (ref 13.0–17.0)
Lymphocytes Relative: 34.4 % (ref 12.0–46.0)
Lymphs Abs: 1.9 10*3/uL (ref 0.7–4.0)
MCHC: 33.9 g/dL (ref 30.0–36.0)
MCV: 94.5 fl (ref 78.0–100.0)
Monocytes Absolute: 0.4 10*3/uL (ref 0.1–1.0)
Monocytes Relative: 7.8 % (ref 3.0–12.0)
Neutro Abs: 3 10*3/uL (ref 1.4–7.7)
Neutrophils Relative %: 55.6 % (ref 43.0–77.0)
Platelets: 205 10*3/uL (ref 150.0–400.0)
RBC: 4.81 Mil/uL (ref 4.22–5.81)
RDW: 13.7 % (ref 11.5–15.5)
WBC: 5.5 10*3/uL (ref 4.0–10.5)

## 2022-05-25 LAB — HEMOGLOBIN A1C: Hgb A1c MFr Bld: 5.5 % (ref 4.6–6.5)

## 2022-05-25 LAB — LIPID PANEL
Cholesterol: 274 mg/dL — ABNORMAL HIGH (ref 0–200)
HDL: 37.1 mg/dL — ABNORMAL LOW (ref >39.00)
NonHDL: 236.65
Total CHOL/HDL Ratio: 7
Triglycerides: 268 mg/dL — ABNORMAL HIGH (ref 0.0–149.0)
VLDL: 53.6 mg/dL — ABNORMAL HIGH (ref 0.0–40.0)

## 2022-05-25 LAB — BASIC METABOLIC PANEL
BUN: 10 mg/dL (ref 6–23)
CO2: 28 mEq/L (ref 19–32)
Calcium: 9.7 mg/dL (ref 8.4–10.5)
Chloride: 103 mEq/L (ref 96–112)
Creatinine, Ser: 1.17 mg/dL (ref 0.40–1.50)
GFR: 78.88 mL/min (ref >60.00)
Glucose, Bld: 86 mg/dL (ref 70–99)
Potassium: 3.8 mEq/L (ref 3.5–5.1)
Sodium: 139 mEq/L (ref 135–145)

## 2022-05-25 LAB — VITAMIN B12: Vitamin B-12: 511 pg/mL (ref 211–911)

## 2022-05-25 LAB — LDL CHOLESTEROL, DIRECT: Direct LDL: 193 mg/dL

## 2022-05-25 LAB — VITAMIN D 25 HYDROXY (VIT D DEFICIENCY, FRACTURES): VITD: 19.26 ng/mL — ABNORMAL LOW (ref 30.00–100.00)

## 2022-05-25 LAB — TESTOSTERONE: Testosterone: 253.66 ng/dL — ABNORMAL LOW (ref 300.00–890.00)

## 2022-05-25 NOTE — Patient Instructions (Signed)
Please continue all other medications as before, and refills have been done if requested.  Please have the pharmacy call with any other refills you may need.  Please continue your efforts at being more active, low cholesterol diet, and weight control.  You are otherwise up to date with prevention measures today.  Please keep your appointments with your specialists as you may have planned  You will be contacted regarding the referral for: echocardiogram  Please go to the LAB at the blood drawing area for the tests to be done  You will be contacted by phone if any changes need to be made immediately.  Otherwise, you will receive a letter about your results with an explanation, but please check with MyChart first.  Please remember to sign up for MyChart if you have not done so, as this will be important to you in the future with finding out test results, communicating by private email, and scheduling acute appointments online when needed.  Please make an Appointment to return for your 1 year visit, or sooner if needed

## 2022-05-25 NOTE — Assessment & Plan Note (Signed)
Last vitamin D Lab Results  Component Value Date   VD25OH 26.01 (L) 02/11/2021   Low, to start oral replacement

## 2022-05-25 NOTE — Progress Notes (Signed)
Patient ID: Donald Swanson, male   DOB: 26-Aug-1981, 41 y.o.   MRN: [id number]         Chief Complaint:: wellness exam and low vit d, obesity, hld, heart murmur, depression       HPI:  Donald Swanson is a 41 y.o. male here for wellness exam; decliens covid booster, flu shot for now, o/w up to date                        Also Pt denies chest pain, increased sob or doe, wheezing, orthopnea, PND, increased LE swelling, palpitations, dizziness or syncope.   Pt denies polydipsia, polyuria, or new focal neuro s/s.   Not taking Vit D.  Hard to lose wt, declines wegovy for now but may consider later.  Has had mild worsening depressive symptoms, but no suicidal ideation, or panic; declines tx for now such as ssri.     Wt Readings from Last 3 Encounters:  05/25/22 182 lb (82.6 kg)  04/20/22 180 lb (81.6 kg)  04/04/21 186 lb 6.4 oz (84.6 kg)   BP Readings from Last 3 Encounters:  05/25/22 120/72  04/20/22 125/83  04/04/21 122/80   Immunization History  Administered Date(s) Administered   Influenza,inj,Quad PF,6+ Mos 07/02/2016   Influenza-Unspecified 07/12/2017, 08/12/2018, 07/13/2019, 08/08/2020   PFIZER(Purple Top)SARS-COV-2 Vaccination 05/16/2020, 06/06/2020   Tdap 02/23/2013   There are no preventive care reminders to display for this patient.     Past Medical History:  Diagnosis Date   Hyperlipemia    Low testosterone    Past Surgical History:  Procedure Laterality Date   ETHMOIDECTOMY Bilateral 07/09/2020   Procedure: TOTAL ETHMOIDECTOMY;  Surgeon: Drema Halon, MD;  Location: [facility];  Service: ENT;  Laterality: Bilateral;   KNEE ARTHROSCOPY WITH MEDIAL MENISECTOMY Right 06/10/2015   Procedure: knee arthroscopy with medial menisectomy  ;  Surgeon: Gean Birchwood, MD;  Location: [facility];  Service: Orthopedics;  Laterality: Right;   MAXILLARY ANTROSTOMY Bilateral 07/09/2020   Procedure: ENDOSCOPIC MAXILLARY ANTROSTOMY;  Surgeon:  Drema Halon, MD;  Location: [facility];  Service: ENT;  Laterality: Bilateral;   NASAL SEPTOPLASTY W/ TURBINOPLASTY Bilateral 07/09/2020   Procedure: NASAL SEPTOPLASTY WITH TURBINATE REDUCTION;  Surgeon: Drema Halon, MD;  Location: [facility];  Service: ENT;  Laterality: Bilateral;   NASAL SINUS SURGERY Bilateral 07/09/2020   Procedure: ENDOSCOPIC SINUS WITH BALLOON DILATION FRONTAL OSTIUM;  Surgeon: Drema Halon, MD;  Location: [facility];  Service: ENT;  Laterality: Bilateral;   SINUS ENDO WITH FUSION Bilateral 07/09/2020   Procedure: SINUS ENDOSCOPY WITH FUSION NAVIGATION;  Surgeon: Drema Halon, MD;  [facility];  Service: ENT;  Laterality: Bilateral;    reports that he has quit smoking. His smoking use included cigarettes. He has a 2.25 pack-year smoking history. He has never used smokeless tobacco. He reports current alcohol use. He reports that he does not use drugs. family history includes COPD in his mother. No Known Allergies No current outpatient medications on file prior to visit.   No current facility-administered medications on file prior to visit.        ROS:  All others reviewed and negative.  Objective        PE:  BP 120/72 (BP Location: Right Arm, Patient Position: Sitting, Cuff Size: Large)   Pulse (!) 50   Temp 97.9 F (36.6 C) (Oral)  Ht 5\' 5"  (1.651 m)   Wt 182 lb (82.6 kg)   SpO2 98%   BMI 30.29 kg/m                 Constitutional: Pt appears in NAD               HENT: Head: NCAT.                Right Ear: External ear normal.                 Left Ear: External ear normal.                Eyes: . Pupils are equal, round, and reactive to light. Conjunctivae and EOM are normal               Nose: without d/c or deformity               Neck: Neck supple. Gross normal ROM               Cardiovascular: Normal rate and regular rhythm. With gr 2-3/6 sys murmur  LUSB                Pulmonary/Chest: Effort normal and breath sounds without rales or wheezing.                Abd:  Soft, NT, ND, + BS, no organomegaly               Neurological: Pt is alert. At baseline orientation, motor grossly intact               Skin: Skin is warm. No rashes, no other new lesions, LE edema - none               Psychiatric: Pt behavior is normal without agitation , mild depressed affect  Micro: none  Cardiac tracings I have personally interpreted today:  none  Pertinent Radiological findings (summarize): none   Lab Results  Component Value Date   WBC 5.5 05/25/2022   HGB 15.4 05/25/2022   HCT 45.4 05/25/2022   PLT 205.0 05/25/2022   GLUCOSE 86 05/25/2022   CHOL 274 (H) 05/25/2022   TRIG 268.0 (H) 05/25/2022   HDL 37.10 (L) 05/25/2022   LDLDIRECT 193.0 05/25/2022   LDLCALC 117 (H) 02/11/2021   ALT 39 05/25/2022   AST 24 05/25/2022   NA 139 05/25/2022   K 3.8 05/25/2022   CL 103 05/25/2022   CREATININE 1.17 05/25/2022   BUN 10 05/25/2022   CO2 28 05/25/2022   TSH 2.34 05/25/2022   PSA 0.68 05/19/2016   HGBA1C 5.5 05/25/2022   Assessment/Plan:  Donald Swanson is a 41 y.o. White or Caucasian [1] male with  has a past medical history of Hyperlipemia and Low testosterone.  Vitamin D deficiency Last vitamin D Lab Results  Component Value Date   VD25OH 26.01 (L) 02/11/2021   Low, to start oral replacement   Encounter for well adult exam with abnormal findings Age and sex appropriate education and counseling updated with regular exercise and diet Referrals for preventative services - none needed Immunizations addressed - decliens covid booster, flu shot Smoking counseling  - none needed Evidence for depression or other mood disorder - mild depression, declines tx Most recent labs reviewed. I have personally reviewed and have noted: 1) the patient's medical and social history 2) The patient's current medications and supplements 3) The  patient's height, weight, and  BMI have been recorded in the chart   Anxiety and depression With mild depression, declines tx for now  Hyperlipidemia Lab Results  Component Value Date   LDLCALC 117 (H) 02/11/2021   Mild uncontrolled, pt to continue current low chol diet, declines statin  Low testosterone Also for testosterone level with labs  Heart murmur Noted, for echocardiogram  Followup: Return in about 1 year (around 05/26/2023).  Oliver Barre, MD 05/29/2022 10:12 PM  [facility]

## 2022-05-29 ENCOUNTER — Encounter: Payer: Self-pay | Admitting: Internal Medicine

## 2022-05-29 NOTE — Assessment & Plan Note (Signed)
Lab Results  Component Value Date   LDLCALC 117 (H) 02/11/2021   Mild uncontrolled, pt to continue current low chol diet, declines statin

## 2022-05-29 NOTE — Assessment & Plan Note (Signed)
With mild depression, declines tx for now

## 2022-05-29 NOTE — Assessment & Plan Note (Signed)
Noted, for echocardiogram

## 2022-05-29 NOTE — Assessment & Plan Note (Signed)
Age and sex appropriate education and counseling updated with regular exercise and diet Referrals for preventative services - none needed Immunizations addressed - decliens covid booster, flu shot Smoking counseling  - none needed Evidence for depression or other mood disorder - mild depression, declines tx Most recent labs reviewed. I have personally reviewed and have noted: 1) the patient's medical and social history 2) The patient's current medications and supplements 3) The patient's height, weight, and BMI have been recorded in the chart

## 2022-05-29 NOTE — Assessment & Plan Note (Signed)
Also for testosterone level with labs 

## 2022-06-16 ENCOUNTER — Other Ambulatory Visit: Payer: Self-pay | Admitting: Internal Medicine

## 2022-06-16 ENCOUNTER — Ambulatory Visit (HOSPITAL_BASED_OUTPATIENT_CLINIC_OR_DEPARTMENT_OTHER)
Admission: RE | Admit: 2022-06-16 | Discharge: 2022-06-16 | Disposition: A | Discharge status: Home / Self Care | Payer: No Typology Code available for payment source | Source: Ambulatory Visit | Attending: Internal Medicine | Admitting: Internal Medicine

## 2022-06-16 DIAGNOSIS — R011 Cardiac murmur, unspecified: Secondary | ICD-10-CM | POA: Insufficient documentation

## 2022-06-16 DIAGNOSIS — Q231 Congenital insufficiency of aortic valve: Secondary | ICD-10-CM | POA: Insufficient documentation

## 2022-06-16 DIAGNOSIS — I351 Nonrheumatic aortic (valve) insufficiency: Secondary | ICD-10-CM | POA: Insufficient documentation

## 2022-06-16 LAB — ECHOCARDIOGRAM COMPLETE
Area-P 1/2: 4.17 cm2
S' Lateral: 3.3 cm

## 2022-06-16 NOTE — Progress Notes (Signed)
  Echocardiogram 2D Echocardiogram has been performed.  Donald Swanson F 06/16/2022, 9:43 AM

## 2022-06-26 ENCOUNTER — Ambulatory Visit
Admission: EM | Admit: 2022-06-26 | Discharge: 2022-06-26 | Disposition: A | Discharge status: Home / Self Care | Payer: No Typology Code available for payment source | Attending: Urgent Care | Admitting: Urgent Care

## 2022-06-26 ENCOUNTER — Encounter: Payer: Self-pay | Admitting: Emergency Medicine

## 2022-06-26 DIAGNOSIS — R0989 Other specified symptoms and signs involving the circulatory and respiratory systems: Secondary | ICD-10-CM

## 2022-06-26 DIAGNOSIS — J018 Other acute sinusitis: Secondary | ICD-10-CM | POA: Diagnosis not present

## 2022-06-26 DIAGNOSIS — Z9889 Other specified postprocedural states: Secondary | ICD-10-CM

## 2022-06-26 DIAGNOSIS — J309 Allergic rhinitis, unspecified: Secondary | ICD-10-CM | POA: Diagnosis not present

## 2022-06-26 MED ORDER — CETIRIZINE HCL 10 MG PO TABS: 10.0000 mg | ORAL_TABLET | Freq: Every day | ORAL | 0 refills | Status: DC

## 2022-06-26 MED ORDER — AMOXICILLIN-POT CLAVULANATE 875-125 MG PO TABS: 1.0000 | ORAL_TABLET | Freq: Two times a day (BID) | ORAL | 0 refills | Status: DC

## 2022-06-26 MED ORDER — PSEUDOEPHEDRINE HCL 60 MG PO TABS: 60.0000 mg | ORAL_TABLET | Freq: Three times a day (TID) | ORAL | 0 refills | Status: DC | PRN

## 2022-06-26 NOTE — ED Triage Notes (Signed)
Pt here with productive cough, congestion, sinus pressure and headache x 2 weeks. Has taken OTC meds and some leftover prednisone without relief.

## 2022-06-26 NOTE — ED Provider Notes (Addendum)
[facility]  Note:  This document was prepared using Conservation officer, historic buildings and may include unintentional dictation errors.  MRN: [id number] DOB: 09/24/1981  Subjective:   Donald Swanson is a 41 y.o. male presenting for several week history of recurrent sinus congestion, sinus drainage, coughing, chest congestion.  The chest symptoms resolved but he continues to have sinus symptoms.  Does not take an allergy medication consistently.  He has a history of ethmoidectomy, nasal sinus surgery, maxillary antrostomy in 2021.  Patient is not a smoker.  No history of respiratory disorders.  No current facility-administered medications for this encounter. No current outpatient medications on file.   No Known Allergies  Past Medical History:  Diagnosis Date   Hyperlipemia    Low testosterone      Past Surgical History:  Procedure Laterality Date   ETHMOIDECTOMY Bilateral 07/09/2020   Procedure: TOTAL ETHMOIDECTOMY;  Surgeon: Drema Halon, MD;  Location: [facility];  Service: ENT;  Laterality: Bilateral;   KNEE ARTHROSCOPY WITH MEDIAL MENISECTOMY Right 06/10/2015   Procedure: knee arthroscopy with medial menisectomy  ;  Surgeon: Gean Birchwood, MD;  Location: [facility];  Service: Orthopedics;  Laterality: Right;   MAXILLARY ANTROSTOMY Bilateral 07/09/2020   Procedure: ENDOSCOPIC MAXILLARY ANTROSTOMY;  Surgeon: Drema Halon, MD;  Location: [facility];  Service: ENT;  Laterality: Bilateral;   NASAL SEPTOPLASTY W/ TURBINOPLASTY Bilateral 07/09/2020   Procedure: NASAL SEPTOPLASTY WITH TURBINATE REDUCTION;  Surgeon: Drema Halon, MD;  Location: [facility];  Service: ENT;  Laterality: Bilateral;   NASAL SINUS SURGERY Bilateral 07/09/2020   Procedure: ENDOSCOPIC SINUS WITH BALLOON DILATION FRONTAL OSTIUM;  Surgeon: Drema Halon, MD;  Location: [facility];   Service: ENT;  Laterality: Bilateral;   SINUS ENDO WITH FUSION Bilateral 07/09/2020   Procedure: SINUS ENDOSCOPY WITH FUSION NAVIGATION;  Surgeon: Drema Halon, MD;  Location: [facility];  Service: ENT;  Laterality: Bilateral;    Family History  Problem Relation Age of Onset   COPD Mother     Social History   Tobacco Use   Smoking status: Former    Packs/day: 0.25    Years: 9.00    Total pack years: 2.25    Types: Cigarettes   Smokeless tobacco: Never  Vaping Use   Vaping Use: Never used  Substance Use Topics   Alcohol use: Yes    Comment: occas   Drug use: No    ROS   Objective:   Vitals: BP 132/86   Pulse (!) 55   Temp 97.9 F (36.6 C)   Resp 18   SpO2 97%   Physical Exam Constitutional:      General: He is not in acute distress.    Appearance: Normal appearance. He is well-developed and normal weight. He is not ill-appearing, toxic-appearing or diaphoretic.  HENT:     Head: Normocephalic and atraumatic.     Right Ear: Tympanic membrane, ear canal and external ear normal. There is no impacted cerumen.     Left Ear: Tympanic membrane, ear canal and external ear normal. There is no impacted cerumen.     Nose: Congestion present. No rhinorrhea.     Mouth/Throat:     Mouth: Mucous membranes are moist.     Pharynx: No oropharyngeal exudate or posterior oropharyngeal erythema.  Eyes:     General: No scleral icterus.       Right eye: No  discharge.        Left eye: No discharge.     Extraocular Movements: Extraocular movements intact.     Conjunctiva/sclera: Conjunctivae normal.  Neck:     Meningeal: Brudzinski's sign and Kernig's sign absent.  Cardiovascular:     Rate and Rhythm: Normal rate and regular rhythm.     Heart sounds: Normal heart sounds. No murmur heard.    No friction rub. No gallop.  Pulmonary:     Effort: Pulmonary effort is normal. No respiratory distress.     Breath sounds: Normal breath sounds. No stridor. No  wheezing, rhonchi or rales.  Musculoskeletal:     Cervical back: Normal range of motion and neck supple. No rigidity. No muscular tenderness.  Lymphadenopathy:     Cervical: No cervical adenopathy.  Neurological:     General: No focal deficit present.     Mental Status: He is alert and oriented to person, place, and time.     Cranial Nerves: No cranial nerve deficit.     Motor: No weakness.     Coordination: Coordination normal.     Gait: Gait normal.  Psychiatric:        Mood and Affect: Mood normal.        Behavior: Behavior normal.        Thought Content: Thought content normal.     Assessment and Plan :   PDMP not reviewed this encounter.  1. Acute non-recurrent sinusitis of other sinus   2. Allergic rhinitis, unspecified seasonality, unspecified trigger   3. H/O sinus surgery   4. Chest congestion    Will start empiric treatment for sinusitis with Augmentin.  Recommended supportive care otherwise including the use of oral antihistamine, decongestant. Deferred imaging given clear cardiopulmonary exam, hemodynamically stable vital signs.  If symptoms fail to improve, consider chest x-ray to rule out pneumonia or oral prednisone in the context of chronic allergic rhinitis.  Counseled patient on potential for adverse effects with medications prescribed/recommended today, ER and return-to-clinic precautions discussed, patient verbalized understanding.   Wallis Bamberg, PA-C 06/26/22 1020

## 2022-07-22 DIAGNOSIS — I77819 Aortic ectasia, unspecified site: Secondary | ICD-10-CM | POA: Insufficient documentation

## 2022-07-22 NOTE — Progress Notes (Signed)
Cardiology Office Note   Date:  07/23/2022   ID:  Donald Swanson, DOB 12/01/1980, MRN [id number]  PCP:  Corwin Levins, MD  Cardiologist:   Rollene Rotunda, MD Referring:  Corwin Levins, MD  Chief Complaint  Patient presents with   Fatigue      History of Present Illness: Donald Swanson is a 41 y.o. male who presents for presents for evaluation of a heart murmur.  He is referred by Corwin Levins, MD.  He was sent for an echo early this month and is found to have a bicuspid aortic valve.  There was no gradient but his aorta is enlarged at 48 mm.    He has no prior cardiac work-up.  He has had occasional fluttering.  He feels his heart rate skipping when he is fatigued.  He does not describe sustained arrhythmias.  He might get some mild dyspnea with exertion.  He does have he thinks more fatigued with activity than he should have.  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  He works for [employer].  He does not describe chest discomfort, neck or arm discomfort.  He is not having any new PND or orthopnea.  He does have daytime somnolence.    Past Medical History:  Diagnosis Date   Hyperlipemia    Low testosterone     Past Surgical History:  Procedure Laterality Date   ETHMOIDECTOMY Bilateral 07/09/2020   Procedure: TOTAL ETHMOIDECTOMY;  Surgeon: Drema Halon, MD;  Location: [facility];  Service: ENT;  Laterality: Bilateral;   KNEE ARTHROSCOPY WITH MEDIAL MENISECTOMY Right 06/10/2015   Procedure: knee arthroscopy with medial menisectomy  ;  Surgeon: Gean Birchwood, MD;  Location: [facility];  Service: Orthopedics;  Laterality: Right;   MAXILLARY ANTROSTOMY Bilateral 07/09/2020   Procedure: ENDOSCOPIC MAXILLARY ANTROSTOMY;  Surgeon: Drema Halon, MD;  Location: [facility];  Service: ENT;  Laterality: Bilateral;   NASAL SEPTOPLASTY W/ TURBINOPLASTY Bilateral 07/09/2020   Procedure: NASAL SEPTOPLASTY WITH TURBINATE REDUCTION;   Surgeon: Drema Halon, MD;  Location: [facility];  Service: ENT;  Laterality: Bilateral;   NASAL SINUS SURGERY Bilateral 07/09/2020   Procedure: ENDOSCOPIC SINUS WITH BALLOON DILATION FRONTAL OSTIUM;  Surgeon: Drema Halon, MD;  Location: [facility];  Service: ENT;  Laterality: Bilateral;   SINUS ENDO WITH FUSION Bilateral 07/09/2020   Procedure: SINUS ENDOSCOPY WITH FUSION NAVIGATION;  Surgeon: Drema Halon, MD;  Location: [facility];  Service: ENT;  Laterality: Bilateral;     No current outpatient medications on file.   No current facility-administered medications for this visit.    Allergies:   Patient has no known allergies.    Social History:  The patient  reports that he has quit smoking. His smoking use included cigarettes. He has a 2.25 pack-year smoking history. He has never used smokeless tobacco. He reports current alcohol use. He reports that he does not use drugs.   Family History:  The patient's family history includes Atrial fibrillation in his mother; COPD in his mother.    ROS:  Please see the history of present illness.   Otherwise, review of systems are positive for none.   All other systems are reviewed and negative.    PHYSICAL EXAM: VS:  BP 124/80   Pulse (!) 50   Ht 5\' 5"  (1.651 m)   Wt 182 lb 9.6 oz (82.8 kg)   SpO2 99%  BMI 30.39 kg/m  , BMI Body mass index is 30.39 kg/m. GENERAL:  Well appearing HEENT:  Pupils equal round and reactive, fundi not visualized, oral mucosa unremarkable NECK:  No jugular venous distention, waveform within normal limits, carotid upstroke brisk and symmetric, no bruits, no thyromegaly LYMPHATICS:  No cervical, inguinal adenopathy LUNGS:  Clear to auscultation bilaterally BACK:  No CVA tenderness CHEST:  Unremarkable HEART:  PMI not displaced or sustained,S1 and S2 within normal limits, no S3, no S4, positive opening clicks, no rubs, 2 out of 6 systolic  murmur brief, no diastolic murmurs murmurs ABD:  Flat, positive bowel sounds normal in frequency in pitch, no bruits, no rebound, no guarding, no midline pulsatile mass, no hepatomegaly, no splenomegaly EXT:  2 plus pulses throughout, no edema, no cyanosis no clubbing SKIN:  No rashes no nodules NEURO:  Cranial nerves II through XII grossly intact, motor grossly intact throughout PSYCH:  Cognitively intact, oriented to person place and time    EKG:  EKG is ordered today. The ekg ordered today demonstrates sinus bradycardia, rate 50, axis within normal limits, intervals within normal limits, no acute ST-T wave changes.   Recent Labs: 05/25/2022: ALT 39; BUN 10; Creatinine, Ser 1.17; Hemoglobin 15.4; Platelets 205.0; Potassium 3.8; Sodium 139; TSH 2.34    Lipid Panel    Component Value Date/Time   CHOL 274 (H) 05/25/2022 1159   TRIG 268.0 (H) 05/25/2022 1159   HDL 37.10 (L) 05/25/2022 1159   CHOLHDL 7 05/25/2022 1159   VLDL 53.6 (H) 05/25/2022 1159   LDLCALC 117 (H) 02/11/2021 1025   LDLDIRECT 193.0 05/25/2022 1159      Wt Readings from Last 3 Encounters:  07/23/22 182 lb 9.6 oz (82.8 kg)  05/25/22 182 lb (82.6 kg)  04/20/22 180 lb (81.6 kg)      Other studies Reviewed: Additional studies/ records that were reviewed today include: Echo ordered by the primary care, labs. Review of the above records demonstrates:  Please see elsewhere in the note.     ASSESSMENT AND PLAN:  Bicuspid aortic valve: We discussed the physiology of this.  I can follow this clinically and with repeat echocardiography in the future.  Of note his blood pressure was equal in both arms  Aortic root dilatation: I will check an aortic CT.  Fatigue: STOP-BANG is 4.  He has a high likelihood of sleep apnea and I will order a home sleep test.   Current medicines are reviewed at length with the patient today.  The patient does not have concerns regarding medicines.  The following changes have been  made:  no change  Labs/ tests ordered today include:   Orders Placed This Encounter  Procedures   CT ANGIO CHEST AORTA W/CM & OR WO/CM   Basic Metabolic Panel (BMET)   EKG 12-Lead   Itamar Sleep Study    Disposition:   FU with me in 1 year   Signed, Minus Breeding, MD  07/23/2022 11:30 AM    [place]

## 2022-07-23 ENCOUNTER — Encounter: Payer: Self-pay | Admitting: Cardiology

## 2022-07-23 ENCOUNTER — Ambulatory Visit: Payer: No Typology Code available for payment source | Attending: Cardiology | Admitting: Cardiology

## 2022-07-23 VITALS — BP 124/80 | HR 50 | Ht 65.0 in | Wt 182.6 lb

## 2022-07-23 DIAGNOSIS — R5383 Other fatigue: Secondary | ICD-10-CM

## 2022-07-23 DIAGNOSIS — G4730 Sleep apnea, unspecified: Secondary | ICD-10-CM

## 2022-07-23 DIAGNOSIS — I77819 Aortic ectasia, unspecified site: Secondary | ICD-10-CM

## 2022-07-23 DIAGNOSIS — Q231 Congenital insufficiency of aortic valve: Secondary | ICD-10-CM

## 2022-07-23 LAB — BASIC METABOLIC PANEL
BUN/Creatinine Ratio: 9 (ref 9–20)
BUN: 10 mg/dL (ref 6–20)
CO2: 27 mmol/L (ref 20–29)
Calcium: 9.8 mg/dL (ref 8.7–10.2)
Chloride: 105 mmol/L (ref 96–106)
Creatinine, Ser: 1.09 mg/dL (ref 0.76–1.27)
Glucose: 83 mg/dL (ref 70–99)
Potassium: 4.2 mmol/L (ref 3.5–5.2)
Sodium: 142 mmol/L (ref 134–144)
eGFR: 89 mL/min/{1.73_m2} (ref >59)

## 2022-07-23 NOTE — Patient Instructions (Addendum)
Medication Instructions:  Your physician recommends that you continue on your current medications as directed. Please refer to the Current Medication list given to you today.  *If you need a refill on your cardiac medications before your next appointment, please call your pharmacy*   Lab Work: Your physician recommends that you have the following lab drawn today : BMET If you have labs (blood work) drawn today and your tests are completely normal, you will receive your results only by: MyChart Message (if you have MyChart) OR A paper copy in the mail If you have any lab test that is abnormal or we need to change your treatment, we will call you to review the results.   Testing/Procedures: CTA Chest/Aorta- THIS WILL TAKE PLACE AT [facility]. SOMEONE WILL REACH OUT TO YOU TO GET THIS SCHEDULED  Follow-Up: At [facility], you and your health needs are our priority.  As part of our continuing mission to provide you with exceptional heart care, we have created designated Provider Care Teams.  These Care Teams include your primary Cardiologist (physician) and Advanced Practice Providers (APPs -  Physician Assistants and Nurse Practitioners) who all work together to provide you with the care you need, when you need it.  We recommend signing up for the patient portal called "MyChart".  Sign up information is provided on this After Visit Summary.  MyChart is used to connect with patients for Virtual Visits (Telemedicine).  Patients are able to view lab/test results, encounter notes, upcoming appointments, etc.  Non-urgent messages can be sent to your provider as well.   To learn more about what you can do with MyChart, go to [email or web address].    Your next appointment:   1 year(s)  The format for your next appointment:   In Person  Provider:   Minus Breeding, MD     Important Information About Sugar

## 2022-07-27 ENCOUNTER — Telehealth: Payer: Self-pay | Admitting: *Deleted

## 2022-07-27 ENCOUNTER — Telehealth: Payer: Self-pay

## 2022-07-27 NOTE — Telephone Encounter (Addendum)
Staff message sent to Debria Garret ok to activate itamar device. Per [place] no PA is required. Call reference # [id number].

## 2022-07-27 NOTE — Telephone Encounter (Signed)
Called and made the patient aware that HE may proceed with the Itamar Home Sleep Study. PIN # provided to the patient. Patient made aware that HE will be contacted after the test has been read with the results and any recommendations. Patient verbalized understanding and thanked me for the call.   

## 2022-07-27 NOTE — Telephone Encounter (Signed)
I called the patient to speak with him about the Itamar device, I lvm for him to return my call

## 2022-07-31 ENCOUNTER — Encounter (HOSPITAL_BASED_OUTPATIENT_CLINIC_OR_DEPARTMENT_OTHER): Payer: No Typology Code available for payment source | Admitting: Cardiology

## 2022-07-31 DIAGNOSIS — G4733 Obstructive sleep apnea (adult) (pediatric): Secondary | ICD-10-CM | POA: Diagnosis not present

## 2022-08-02 NOTE — Procedures (Signed)
Patient Information Study Date: 07/31/22 Patient Name: Donald Swanson Patient ID: [id number] Birth Date: 02-09-1981 Age: 41 Gender: Male BMI: 30.5 (W=183 lb, H=5' 5'') Referring Physician: Rollene Rotunda, MD  TEST DESCRIPTION:  Home sleep apnea testing was completed using the WatchPat, a Type 1 device, utilizing peripheral arterial tonometry (PAT), chest movement, actigraphy, pulse oximetry, pulse rate, body position and snore.  AHI was calculated with apnea and hypopnea using valid sleep time as the denominator. RDI includes apneas, hypopneas, and RERAs.  The data acquired and the scoring of sleep and all associated events were performed in accordance with the recommended standards and specifications as outlined in the AASM Manual for the Scoring of Sleep and Associated Events 2.2.0 (2015).  FINDINGS:  1.  Moderate Obstructive Sleep Apnea with AHI 27.7/hr.   2.  No significant Central Sleep Apnea with pAHIc 4.6/hr.  3.  Oxygen desaturations as low as 83%.  4.  Mild snoring was present. O2 sats were < 88% for 3.5 min.  5.  Total sleep time was 8 hrs and 3 min.  6.  13.2% of total sleep time was spent in REM sleep.   7.  Shortened sleep onset latency at 5 min  8.  Prolonged REM sleep onset latency at 303 min.   9.  Total awakenings were 21.  10. Arrhythmia detection:  None.  DIAGNOSIS:   Moderate Obstructive Sleep Apnea (G47.33)  RECOMMENDATIONS:   1.  Clinical correlation of these findings is necessary.  The decision to treat obstructive sleep apnea (OSA) is usually based on the presence of apnea symptoms or the presence of associated medical conditions such as Hypertension, Congestive Heart Failure, Atrial Fibrillation or Obesity.  The most common symptoms of OSA are snoring, gasping for breath while sleeping, daytime sleepiness and fatigue.   2.  Initiating apnea therapy is recommended given the presence of symptoms and/or associated conditions. Recommend proceeding with one  of the following:     a.  Auto-CPAP therapy with a pressure range of 5-20cm H2O.     b.  An oral appliance (OA) that can be obtained from certain dentists with expertise in sleep medicine.  These are primarily of use in non-obese patients with mild and moderate disease.     c.  An ENT consultation which may be useful to look for specific causes of obstruction and possible treatment options.     d.  If patient is intolerant to PAP therapy, consider referral to ENT for evaluation for hypoglossal nerve stimulator.   3.  Close follow-up is necessary to ensure success with CPAP or oral appliance therapy for maximum benefit.  4.  A follow-up oximetry study on CPAP is recommended to assess the adequacy of therapy and determine the need for supplemental oxygen or the potential need for Bi-level therapy.  An arterial blood gas to determine the adequacy of baseline ventilation and oxygenation should also be considered.  5.  Healthy sleep recommendations include:  adequate nightly sleep (normal 7-9 hrs/night), avoidance of caffeine after noon and alcohol near bedtime, and maintaining a sleep environment that is cool, dark and quiet.  6.  Weight loss for overweight patients is recommended.  Even modest amounts of weight loss can significantly improve the severity of sleep apnea.  7.  Snoring recommendations include:  weight loss where appropriate, side sleeping, and avoidance of alcohol before bed.  8.  Operation of motor vehicle should not be performed when sleepy.  Signature: Armanda Magic, MD; [facility]; Diplomat, American Board  of Sleep Medicine Electronically Signed: 08/02/22

## 2022-08-03 ENCOUNTER — Ambulatory Visit: Payer: No Typology Code available for payment source | Attending: Cardiology

## 2022-08-03 DIAGNOSIS — R5383 Other fatigue: Secondary | ICD-10-CM

## 2022-08-03 DIAGNOSIS — G4730 Sleep apnea, unspecified: Secondary | ICD-10-CM

## 2022-08-11 ENCOUNTER — Other Ambulatory Visit: Payer: Self-pay | Admitting: Cardiology

## 2022-08-11 ENCOUNTER — Telehealth: Payer: Self-pay | Admitting: *Deleted

## 2022-08-11 DIAGNOSIS — G4736 Sleep related hypoventilation in conditions classified elsewhere: Secondary | ICD-10-CM

## 2022-08-11 DIAGNOSIS — G4733 Obstructive sleep apnea (adult) (pediatric): Secondary | ICD-10-CM

## 2022-08-11 NOTE — Telephone Encounter (Signed)
Patient notified of sleep study results and recommendations. He agrees to proceed with CPAP treatment.

## 2022-08-11 NOTE — Telephone Encounter (Signed)
-----   Message from Sueanne Margarita, MD sent at 08/02/2022  7:41 PM EDT ----- Please let patient know that they have sleep apnea and recommend treating with CPAP.  Please order an auto CPAP from 4-15cm H2O with heated humidity and mask of choice.  Order overnight pulse ox on CPAP.  Followup with me in 6 weeks.

## 2022-08-26 ENCOUNTER — Ambulatory Visit
Admission: RE | Admit: 2022-08-26 | Discharge: 2022-08-26 | Disposition: A | Discharge status: Home / Self Care | Payer: No Typology Code available for payment source | Source: Ambulatory Visit | Attending: Cardiology | Admitting: Cardiology

## 2022-08-26 DIAGNOSIS — I77819 Aortic ectasia, unspecified site: Secondary | ICD-10-CM

## 2022-08-26 MED ORDER — IOPAMIDOL (ISOVUE-370) INJECTION 76%
75.0000 mL | Freq: Once | INTRAVENOUS | Status: AC | PRN
  Administered 2022-08-26: 75 mL via INTRAVENOUS

## 2022-08-28 ENCOUNTER — Telehealth: Payer: Self-pay | Admitting: *Deleted

## 2022-08-28 DIAGNOSIS — Q231 Congenital insufficiency of aortic valve: Secondary | ICD-10-CM

## 2022-08-28 DIAGNOSIS — I77819 Aortic ectasia, unspecified site: Secondary | ICD-10-CM

## 2022-08-28 DIAGNOSIS — R9389 Abnormal findings on diagnostic imaging of other specified body structures: Secondary | ICD-10-CM

## 2022-08-28 NOTE — Telephone Encounter (Signed)
-----   Message from Rollene Rotunda, MD sent at 08/28/2022 12:26 PM EST ----- Please see my previous note.  I did review with him.  He needs a 50-month CT ascending aorta with contrast to look at the aortic size.  I would also like him to see pulmonary to discuss next tests with his pulmonary nodules.  Please send results to Corwin Levins, MD

## 2022-08-28 NOTE — Telephone Encounter (Signed)
Per previous note - from Dr Antoine Poche   Order placed for Ct Angio of Aorta for May 2024  ( bmp before Ct)   and amb referral for pulm.

## 2022-08-31 ENCOUNTER — Other Ambulatory Visit: Payer: Self-pay | Admitting: *Deleted

## 2022-08-31 DIAGNOSIS — R9389 Abnormal findings on diagnostic imaging of other specified body structures: Secondary | ICD-10-CM

## 2022-09-24 ENCOUNTER — Encounter: Payer: No Typology Code available for payment source | Admitting: Pulmonary Disease

## 2022-09-24 ENCOUNTER — Ambulatory Visit (INDEPENDENT_AMBULATORY_CARE_PROVIDER_SITE_OTHER): Payer: No Typology Code available for payment source | Admitting: Emergency Medicine

## 2022-09-24 ENCOUNTER — Encounter: Payer: Self-pay | Admitting: Emergency Medicine

## 2022-09-24 VITALS — BP 124/76 | HR 53 | Temp 98.1°F | Ht 65.0 in | Wt 181.4 lb

## 2022-09-24 DIAGNOSIS — R918 Other nonspecific abnormal finding of lung field: Secondary | ICD-10-CM | POA: Diagnosis not present

## 2022-09-24 DIAGNOSIS — R911 Solitary pulmonary nodule: Secondary | ICD-10-CM

## 2022-09-24 DIAGNOSIS — Z006 Encounter for examination for normal comparison and control in clinical research program: Secondary | ICD-10-CM

## 2022-09-24 NOTE — Patient Instructions (Signed)
We reviewed your CT scan of the chest today. We will order lab work today We will plan to repeat your CT chest in 6 months (the same CT angio planned to evaluate your aorta) We discussed a clinical trial aimed at helping to assess risk of pulmonary nodules.  We will give you more information about this today. Follow Dr. Delton Coombes in 6 months after your CT chest so we can review the results together.

## 2022-09-24 NOTE — Assessment & Plan Note (Signed)
Right-sided pulmonary nodules of unclear cause, largest 10 mm in the right middle lobe.  I do not see on the left.  Differential diagnosis is broad.  We discussed this today.  We will perform some screening labs to rule out autoimmune or inflammatory process.  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  He is already planning to have a repeat CT scan of the chest in 6 months to evaluate his aorta.  We can follow the nodules on that scan.  Depending on interval change we will decide whether navigational bronchoscopy and biopsy is indicated.  I will also give him more information about the Veracyte trial to risk stratify nodules.

## 2022-09-24 NOTE — Progress Notes (Signed)
Subjective:    Patient ID: Donald Swanson, male    DOB: 11-20-1980, 41 y.o.   MRN: [id number]  HPI 41 year old man with a history of former tobacco use (3-4 pack years), hyperlipidemia, moderate OSA (AHI 27.7 07/31/2022), low testosterone, sinus surgery.  He has been evaluated by cardiology for heart murmur, found to have bicuspid aortic valve and some associated aortic enlargement on echocardiogram.  An aortic CT was performed as below to characterize the aorta.  He is here to discuss that scan. He describes fatigue, trying to use CPAP but having trouble with it. He sometimes feels a flutter in his chest, palpitations. Good exertional tolerance. He has noticed a rash - macular erythematous on his trunk for a few months.   CT angio chest/aorta 08/26/2022 reviewed by me, shows an aorta 4.5 cm, arch 2.6 cm, descending 2.2 cm.  Evidence for some anterior mediastinal fat stranding but no mediastinal or hilar adenopathy.  There were multiple right-sided pulmonary nodules, largest 10 mm in the right middle lobe   CBC as below, normal eosinophil counts    Latest Ref Rng & Units 05/25/2022   11:59 AM 02/11/2021   10:25 AM 08/19/2020   12:29 PM  CBC  WBC 4.0 - 10.5 K/uL 5.5  6.3  6.2   Hemoglobin 13.0 - 17.0 g/dL 10.2  58.5  27.7   Hematocrit 39.0 - 52.0 % 45.4  46.4  34.9   Platelets 150.0 - 400.0 K/uL 205.0  205.0  215.0       Review of Systems As per HPI  Past Medical History:  Diagnosis Date   Hyperlipemia    Low testosterone      Family History  Problem Relation Age of Onset   COPD Mother    Atrial fibrillation Mother     No hx lung CA  Social History   Socioeconomic History   Marital status: Single    Spouse name: Not on file   Number of children: 1   Years of education: 14   Highest education level: Not on file  Occupational History   Occupation: Curator  Tobacco Use   Smoking status: Former    Packs/day: 0.25    Years: 9.00    Total pack years: 2.25     Types: Cigarettes   Smokeless tobacco: Never  Vaping Use   Vaping Use: Never used  Substance and Sexual Activity   Alcohol use: Yes    Comment: occas   Drug use: No   Sexual activity: Not on file  Other Topics Concern   Not on file  Social History Narrative   Fun: Work on cars, guns, build   Social Determinants of Corporate investment banker Strain: Not on file  Food Insecurity: Not on file  Transportation Needs: Not on file  Physical Activity: Not on file  Stress: Not on file  Social Connections: Not on file  Intimate Partner Violence: Not on file    Works Naval architect and maintenance for [employer] Has remodeled houses, ? 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   No facility-administered medications prior to visit.        Objective:   Physical Exam Vitals:   09/24/22 0928  BP: 124/76  Pulse: (!) 53  Temp: 98.1 F (36.7 C)  TempSrc: Oral  SpO2: 98%  Weight: 181  lb 6.4 oz (82.3 kg)  Height: 5\' 5"  (1.651 m)    Gen: Pleasant, well-nourished, in no distress,  normal affect  ENT: No lesions,  mouth clear,  oropharynx clear, no postnasal drip  Neck: No JVD, no stridor  Lungs: No use of accessory muscles, no crackles or wheezing on normal respiration, no wheeze on forced expiration  Cardiovascular: RRR, soft early systolic murmur with an intact S2  Musculoskeletal: No deformities, no cyanosis or clubbing  Neuro: alert, awake, non focal  Skin: Warm, pink macular rash on his upper trunk       Assessment & Plan:   Pulmonary nodules/lesions, multiple Right-sided pulmonary nodules of unclear cause, largest 10 mm in the right middle lobe.  I do not see on the left.  Differential diagnosis is broad.  We discussed this today.  We will perform some screening labs to rule out autoimmune or  inflammatory process.  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  He is already planning to have a repeat CT scan of the chest in 6 months to evaluate his aorta.  We can follow the nodules on that scan.  Depending on interval change we will decide whether navigational bronchoscopy and biopsy is indicated.  I will also give him more information about the [employer] trial to risk stratify nodules.   , MD, PhD 09/24/2022, 10:04 AM [facility] [phone] or if no answer before 7:00PM call [phone] For any issues after 7:00PM please call eLink [phone]

## 2022-09-24 NOTE — Research (Signed)
Title: [place]: CliNIcal Utility of ManaGement of Patients witH CT and LDCT Identified Pulmonary Nodules UsinG the Percepta NasAL Swab ClassifiEr -- with Familiarization   Protocol #: [id number] Sponsor: [employer]   Protocol Revision 1 dated 01Sep2022 and confirmed current on today's visit, IRB approved Revision 1 on 29Dec2022.   Objectives:  Primary: To evaluate if use of the Percepta Nasal Swab test in the diagnostic work up of newly identified pulmonary nodules reduces the number of invasive procedures in the group classified as low-risk by the test and that are benign as compared to a control group managed without a Percepta Nasal Swab test result.                   A newly identified nodule is defined as any nodule first identified on imaging                   <90 days prior to nasal sample collection that hasn't undergone a diagnostic                    procedure for the management of their index nodule prior to enrollment.                   CT imaging includes conventional CT, LDCT, HRCT                   Benign diagnosis is defined as a specific diagnosis of a benign condition,                    radiographic resolution or stability at ? 24 months, or no cytological,                    radiological, or pathological evidence of cancer.                   Procedures will be categorized as either invasive or non-invasive in the Data                    Management Plan (DMP). Secondary: To evaluate if use of the Percepta Nasal Swab test in the diagnostic work up of newly identified pulmonary nodules increases the proportion of subjects classified as high-risk by the test and have primary lung cancer that go directly to appropriate therapy as compared to a control group managed without a Percepta Nasal Swab test result.                    Proportion of subjects that go directly to appropriate therapy is defined as those                     subjects that undergo surgery, ablative  or other appropriate therapy as the next                     step after the Percepta Nasal Swab test result without intervening non-surgical                     procedures                             a. Non-surgical procedures include diagnostic PET, but not PET for                                   staging purposes.                             b. Appropriate therapies will be defined in the CRF.                    A newly identified nodule is defined as any nodule first identified on imaging                    <90 days prior to nasal sample collection.                    Lung cancer diagnosis is defined as established by cytology or pathology, or in                     circumstances where a presumptive diagnosis of cancer led to definitive                     ablative or other appropriate therapy without pathology. Key Inclusion Criteria:  Inclusion Criteria:  Able to tolerate nasal epithelial specimen collection  Signed written Informed Consent obtained  Subject clinical history available for review by sponsor and regulatory agencies  New nodule first identified on imaging < 90 days prior to nasal sample collection (index nodule)  CT report available for index nodule  13 - 75 years of age  Current or former smoker (>100 cigarettes in a lifetime)  Pulmonary nodule ?30 mm detected by CT  Key Exclusion Criteria: Exclusion Criteria  Subject has undergone a diagnostic procedure for the management of their index nodule after the index CT and prior to enrollment  Active cancer (other than non-melanoma skin cancer)  Prior primary lung cancer (prior non-lung cancer acceptable)  Prior participation in this study (i.e., subjects may not be enrolled more than once)  Current active treatment with an investigational device or drug (patients in trial follow up period are okay if intervention phase is complete)  Patient enrolled or planned to be enrolled in another clinical trial that may influence  management of the patient's nodule  Concurrent or planned use of tools or tests for assigning lung nodule risk of malignancy (e.g., genomic or proteomic blood tests) other than clinically validated risk calculators  Clinical Research Coordinator / Research RN note : This visit is for Donald Swanson, Subject [phone] with DOB: 27/Sep/2021 on 14/Dec/2023 for the above protocol is an Enrollment Visit and is for purpose of research.    Subject expressed interest and consent in continuing as a study subject. Subject confirmed contact information (e.g. address, telephone, email). Subject thanked for participation in research and contribution to science.     During this visit on 14/Dec/2023, the subject reviewed and signed the consent form, provided demographics, and had a nasal swab collected per the above referenced protocol. Please refer to the subject's paper source binder for further details.   The PI and Sub-I met/discussed the subject prior to consenting patient.  The sub-Investigator Dr. Baltazar Apo, MD PhD was present for the consenting process.         Signed by Centre Assistant [phone]

## 2022-09-26 LAB — QUANTIFERON-TB GOLD PLUS
Mitogen-NIL: >10 IU/mL
NIL: 0.04 IU/mL
QuantiFERON-TB Gold Plus: NEGATIVE
TB1-NIL: 0 IU/mL
TB2-NIL: 0 IU/mL

## 2022-09-27 LAB — ANCA SCREEN W REFLEX TITER: ANCA SCREEN: NEGATIVE

## 2022-09-27 LAB — ANA: Anti Nuclear Antibody (ANA): NEGATIVE

## 2022-09-27 LAB — ANGIOTENSIN CONVERTING ENZYME: Angiotensin-Converting Enzyme: 22 U/L (ref 9–67)

## 2022-09-27 LAB — RHEUMATOID FACTOR: Rheumatoid fact SerPl-aCnc: <14 IU/mL (ref <14)

## 2022-10-01 ENCOUNTER — Encounter: Payer: Self-pay | Admitting: Cardiology

## 2022-10-01 ENCOUNTER — Ambulatory Visit: Payer: No Typology Code available for payment source | Attending: Cardiology | Admitting: Cardiology

## 2022-10-01 VITALS — BP 118/70 | HR 65 | Ht 65.0 in | Wt 180.6 lb

## 2022-10-01 DIAGNOSIS — G4733 Obstructive sleep apnea (adult) (pediatric): Secondary | ICD-10-CM | POA: Diagnosis not present

## 2022-10-01 NOTE — Progress Notes (Signed)
Sleep Medicine CONSULT Note    Date:  10/01/2022   ID:  Donald Swanson, DOB [birth year], MRN [id number]  PCP:  Corwin Levins, MD  Cardiologist: Rollene Rotunda, MD  Chief Complaint  Patient presents with   New Patient (Initial Visit)    Obstructive sleep apnea    History of Present Illness:  Donald Swanson is a 41 y.o. male who is being seen today for the evaluation of obstructive sleep apnea at the request of Rollene Rotunda, MD.  This is a 41 year old male with a history of hyperlipidemia and heart murmur found to have bicuspid aortic valve and aortic aneurysm measuring 48 mm.  When he saw Dr. Antoine Poche back in October he mentioned that he was having problems with excessive daytime sleepiness. His STOP-BANG score was 4.  He underwent home sleep study which showed moderate to severe obstructive sleep apnea with an AHI of 27.7/h and O2 saturations as low as 83%.  He was started on auto CPAP from 4 to 15 cm H2O.  He is now referred for sleep evaluation to establish sleep care with a sleep physician for further treatment of his obstructive sleep apnea.  He says that he is not doing well with his CPAP.  He cannot find a comfortable position to sleep in due to his mask.  He has tried several masks but he sleeps a lot on his stomach and cannot get the mask to work with his body position.  He is not interested in the inspire device at this time. He has tried the [facility] which made him feel like he was smothering.  He also tried a under the nose FFM but it hurt his nose.  He has not tried a nasal pillow mask.  He thinks he may breathe some through his mouth.  He had nasal septal surgery in the past and breathes better through his nose now.     Past Medical History:  Diagnosis Date   Hyperlipemia    Low testosterone     Past Surgical History:  Procedure Laterality Date   ETHMOIDECTOMY Bilateral 07/09/2020   Procedure: TOTAL ETHMOIDECTOMY;  Surgeon: Drema Halon, MD;   Location: [facility];  Service: ENT;  Laterality: Bilateral;   KNEE ARTHROSCOPY WITH MEDIAL MENISECTOMY Right 06/10/2015   Procedure: knee arthroscopy with medial menisectomy  ;  Surgeon: Gean Birchwood, MD;  Location: [facility];  Service: Orthopedics;  Laterality: Right;   MAXILLARY ANTROSTOMY Bilateral 07/09/2020   Procedure: ENDOSCOPIC MAXILLARY ANTROSTOMY;  Surgeon: Drema Halon, MD;  Location: [facility];  Service: ENT;  Laterality: Bilateral;   NASAL SEPTOPLASTY W/ TURBINOPLASTY Bilateral 07/09/2020   Procedure: NASAL SEPTOPLASTY WITH TURBINATE REDUCTION;  Surgeon: Drema Halon, MD;  Location: [facility];  Service: ENT;  Laterality: Bilateral;   NASAL SINUS SURGERY Bilateral 07/09/2020   Procedure: ENDOSCOPIC SINUS WITH BALLOON DILATION FRONTAL OSTIUM;  Surgeon: Drema Halon, MD;  Location: [facility];  Service: ENT;  Laterality: Bilateral;   SINUS ENDO WITH FUSION Bilateral 07/09/2020   Procedure: SINUS ENDOSCOPY WITH FUSION NAVIGATION;  Surgeon: Drema Halon, MD;  Location: [facility];  Service: ENT;  Laterality: Bilateral;    Current Medications: No outpatient medications have been marked as taking for the 10/01/22 encounter (Office Visit) with Quintella Reichert, MD.    Allergies:   Patient has no known allergies.   Social History   Socioeconomic History  Marital status: Single    Spouse name: Not on file   Number of children: 1   Years of education: 14   Highest education level: Not on file  Occupational History   Occupation: Curator  Tobacco Use   Smoking status: Former    Packs/day: 0.25    Years: 9.00    Total pack years: 2.25    Types: Cigarettes   Smokeless tobacco: Never  Vaping Use   Vaping Use: Never used  Substance and Sexual Activity   Alcohol use: Yes    Comment: occas   Drug use: No   Sexual activity: Not on file  Other Topics Concern    Not on file  Social History Narrative   Fun: Work on cars, guns, build   Social Determinants of Corporate investment banker Strain: Not on file  Food Insecurity: Not on file  Transportation Needs: Not on file  Physical Activity: Not on file  Stress: Not on file  Social Connections: Not on file     Family History:  The patient's family history includes Atrial fibrillation in his mother; COPD in his mother.   ROS:   Please see the history of present illness.    ROS All other systems reviewed and are negative.      No data to display             PHYSICAL EXAM:   VS:  There were no vitals taken for this visit.   GEN: Well nourished, well developed, in no acute distress  HEENT: normal  Neck: no JVD, carotid bruits, or masses Cardiac: RRR; no murmurs, rubs, or gallops,no edema.  Intact distal pulses bilaterally.  Respiratory:  clear to auscultation bilaterally, normal work of breathing GI: soft, nontender, nondistended, + BS MS: no deformity or atrophy  Skin: warm and dry, no rash Neuro:  Alert and Oriented x 3, Strength and sensation are intact Psych: euthymic mood, full affect  Wt Readings from Last 3 Encounters:  09/24/22 181 lb 6.4 oz (82.3 kg)  07/23/22 182 lb 9.6 oz (82.8 kg)  05/25/22 182 lb (82.6 kg)      Studies/Labs Reviewed:   HST, PAP compliance download  Recent Labs: 05/25/2022: ALT 39; Hemoglobin 15.4; Platelets 205.0; TSH 2.34 07/23/2022: BUN 10; Creatinine, Ser 1.09; Potassium 4.2; Sodium 142    Additional studies/ records that were reviewed today include:  Office visit notes by Dr. Antoine Poche    ASSESSMENT:    1. OSA (obstructive sleep apnea)      PLAN:  In order of problems listed above:  OSA - The patient is tolerating PAP therapy well without any problems. The PAP download performed by his DME was personally reviewed and interpreted by me today and showed an AHI of 2.4 /hr on auto CPAP from 4-15 cm H2O with 7% compliance in using  more than 4 hours nightly.  The patient has been using and benefiting from PAP use and will continue to benefit from therapy.  -he has not been doing well with finding a mask that works for him and therefore has been noncompliant -He has tried a FFM and an under the nose FFM but does not like then -he is not interested in the [place] device -I will order a nasal pillow mask with chin strap -I encouraged him to be more compliant with his device.  He understands that if he is not using his device at least 4 hours 70% of the time his insurance company will make  him turn his device back in  Followup with me in 2 months  Time Spent: 20 minutes total time of encounter, including 15 minutes spent in face-to-face patient care on the date of this encounter. This time includes coordination of care and counseling regarding above mentioned problem list. Remainder of non-face-to-face time involved reviewing chart documents/testing relevant to the patient encounter and documentation in the medical record. I have independently reviewed documentation from referring provider  Medication Adjustments/Labs and Tests Ordered: Current medicines are reviewed at length with the patient today.  Concerns regarding medicines are outlined above.  Medication changes, Labs and Tests ordered today are listed in the Patient Instructions below.  There are no Patient Instructions on file for this visit.   Signed, Armanda Magic, MD  10/01/2022 2:08 PM    [facility] [address] [place], [place], [place]  [address] Phone: [phone]; Fax: [phone]

## 2022-10-01 NOTE — Patient Instructions (Signed)
Medication Instructions:  Your physician recommends that you continue on your current medications as directed. Please refer to the Current Medication list given to you today.  *If you need a refill on your cardiac medications before your next appointment, please call your pharmacy*   Lab Work: None. If you have labs (blood work) drawn today and your tests are completely normal, you will receive your results only by: MyChart Message (if you have MyChart) OR A paper copy in the mail If you have any lab test that is abnormal or we need to change your treatment, we will call you to review the results.   Testing/Procedures: None.   Follow-Up: Your next appointment:   2 month(s)  The format for your next appointment:   In Person  Provider:   Dr. Armanda Magic, MD   Important Information About Sugar

## 2022-10-02 ENCOUNTER — Telehealth: Payer: Self-pay | Admitting: *Deleted

## 2022-10-02 DIAGNOSIS — R0683 Snoring: Secondary | ICD-10-CM

## 2022-10-02 DIAGNOSIS — R5383 Other fatigue: Secondary | ICD-10-CM

## 2022-10-02 DIAGNOSIS — G4736 Sleep related hypoventilation in conditions classified elsewhere: Secondary | ICD-10-CM

## 2022-10-02 DIAGNOSIS — G4733 Obstructive sleep apnea (adult) (pediatric): Secondary | ICD-10-CM

## 2022-10-02 NOTE — Telephone Encounter (Signed)
Orders placed to [facility] to order nasal pillow mask with chin strap.

## 2022-10-02 NOTE — Telephone Encounter (Signed)
-----   Message from Luellen Pucker, RN sent at 10/01/2022  2:39 PM EST ----- Per Dr. Mayford Knife, patient needs a nasal pillow mask with chin strap.   Thanks! Alcario Drought, RN

## 2022-12-10 ENCOUNTER — Ambulatory Visit: Payer: 59 | Admitting: Cardiology

## 2023-01-15 ENCOUNTER — Ambulatory Visit
Admission: EM | Admit: 2023-01-15 | Discharge: 2023-01-15 | Disposition: A | Discharge status: Home / Self Care | Payer: 59 | Attending: Nurse Practitioner | Admitting: Nurse Practitioner

## 2023-01-15 DIAGNOSIS — R21 Rash and other nonspecific skin eruption: Secondary | ICD-10-CM | POA: Diagnosis not present

## 2023-01-15 MED ORDER — TRIAMCINOLONE ACETONIDE 0.1 % EX CREA: 1.0000 | TOPICAL_CREAM | Freq: Two times a day (BID) | CUTANEOUS | 1 refills | Status: DC

## 2023-01-15 NOTE — Discharge Instructions (Signed)
Schedule appoint with dermatology for further evaluation of your persistent rash Topical Kenalog cream to the affected rash areas twice daily as needed Please follow-up with your PCP if symptoms do not improve Please go to the ER for any worsening symptoms

## 2023-01-15 NOTE — ED Triage Notes (Signed)
Pt presents with c/o a rash that comes and goes x 6 months. States this is the worst it has been.   Pt states it itches.

## 2023-01-15 NOTE — ED Provider Notes (Signed)
[facility]    CSN: [id number] Arrival date & time: 01/15/23  1545      History   Chief Complaint Chief Complaint  Patient presents with   Rash    HPI Donald Swanson is a 42 y.o. male presents for evaluation of rash.  Patient reports 6 months of waxing and waning mildly pruritic rash on torso, arms, legs.  Denies any swelling or drainage.  It is mildly pruritic.  No fevers or chills.  No throat/tongue/lip swelling or difficulty breathing/shortness of breath.  He has been seen a couple of times for this and other clinics.  He has been prescribed oral steroids which she says does not really help.  He finds most beneficial topical cortisone cream that he gets over-the-counter.  No history of eczema or psoriasis.  He has not seen dermatology for this.  Denies any new contacts including soaps, medications, etc.  No other concerns at this time.   Rash   Past Medical History:  Diagnosis Date   Hyperlipemia    Low testosterone     Patient Active Problem List   Diagnosis Date Noted   Pulmonary nodules/lesions, multiple 09/24/2022   Acquired dilation of ascending aorta and aortic root 07/22/2022   Mild aortic insufficiency 06/16/2022   Bicuspid aortic valve 06/16/2022   Heart murmur 05/25/2022   Migraine headache 04/04/2021   Vitamin D deficiency 02/18/2021   Bradycardia 02/18/2021   Anxiety and depression 10/18/2018   Rash 04/11/2018   Allergic rhinitis 01/27/2018   Irritable bowel syndrome (IBS) 01/27/2018   Fatigue 10/13/2017   Sleep disturbance 10/13/2017   Snoring 10/13/2017   Deviated nasal septum 10/13/2017   Chronic tonsillitis 10/13/2017   Polycythemia 10/13/2017   Encounter for well adult exam with abnormal findings 05/14/2016   Left carpal tunnel syndrome 03/10/2016   Low testosterone 12/17/2015   Hyperlipidemia 12/17/2015   Sinusitis, acute 12/17/2015    Past Surgical History:  Procedure Laterality Date   ETHMOIDECTOMY Bilateral 07/09/2020    Procedure: TOTAL ETHMOIDECTOMY;  Surgeon: Drema HalonNewman, Christopher E, MD;  Location: [facility];  Service: ENT;  Laterality: Bilateral;   KNEE ARTHROSCOPY WITH MEDIAL MENISECTOMY Right 06/10/2015   Procedure: knee arthroscopy with medial menisectomy  ;  Surgeon: Gean BirchwoodFrank Rowan, MD;  Location: [facility];  Service: Orthopedics;  Laterality: Right;   MAXILLARY ANTROSTOMY Bilateral 07/09/2020   Procedure: ENDOSCOPIC MAXILLARY ANTROSTOMY;  Surgeon: Drema HalonNewman, Christopher E, MD;  Location: [facility];  Service: ENT;  Laterality: Bilateral;   NASAL SEPTOPLASTY W/ TURBINOPLASTY Bilateral 07/09/2020   Procedure: NASAL SEPTOPLASTY WITH TURBINATE REDUCTION;  Surgeon: Drema HalonNewman, Christopher E, MD;  Location: [facility];  Service: ENT;  Laterality: Bilateral;   NASAL SINUS SURGERY Bilateral 07/09/2020   Procedure: ENDOSCOPIC SINUS WITH BALLOON DILATION FRONTAL OSTIUM;  Surgeon: Drema HalonNewman, Christopher E, MD;  Location: [facility];  Service: ENT;  Laterality: Bilateral;   SINUS ENDO WITH FUSION Bilateral 07/09/2020   Procedure: SINUS ENDOSCOPY WITH FUSION NAVIGATION;  Surgeon: Drema HalonNewman, Christopher E, MD;  Location: [facility];  Service: ENT;  Laterality: Bilateral;       Home Medications    Prior to Admission medications   Medication Sig Start Date End Date Taking? Authorizing Provider  triamcinolone cream (KENALOG) 0.1 % Apply 1 Application topically 2 (two) times daily. 01/15/23  Yes Radford PaxMayer, Jodi R, NP    Family History Family History  Problem Relation Age of Onset   COPD Mother  Atrial fibrillation Mother     Social History Social History   Tobacco Use   Smoking status: Former    Packs/day: 0.25    Years: 9.00    Additional pack years: 0.00    Total pack years: 2.25    Types: Cigarettes   Smokeless tobacco: Never  Vaping Use   Vaping Use: Never used  Substance Use Topics   Alcohol use: Yes    Comment: occas   Drug  use: No     Allergies   Patient has no known allergies.   Review of Systems Review of Systems  Skin:  Positive for rash.     Physical Exam Triage Vital Signs ED Triage Vitals  Enc Vitals Group     BP 01/15/23 1618 126/82     Pulse Rate 01/15/23 1618 (!) 56     Resp 01/15/23 1618 18     Temp 01/15/23 1618 98.5 F (36.9 C)     Temp Source 01/15/23 1618 Oral     SpO2 01/15/23 1618 96 %     Weight --      Height --      Head Circumference --      Peak Flow --      Pain Score 01/15/23 1617 0     Pain Loc --      Pain Edu? --      Excl. in GC? --    No data found.  Updated Vital Signs BP 126/82 (BP Location: Right Arm)   Pulse (!) 56   Temp 98.5 F (36.9 C) (Oral)   Resp 18   SpO2 96%   Visual Acuity Right Eye Distance:   Left Eye Distance:   Bilateral Distance:    Right Eye Near:   Left Eye Near:    Bilateral Near:     Physical Exam Vitals and nursing note reviewed.  Constitutional:      Appearance: Normal appearance.  HENT:     Head: Normocephalic and atraumatic.  Eyes:     Pupils: Pupils are equal, round, and reactive to light.  Cardiovascular:     Rate and Rhythm: Normal rate.  Pulmonary:     Effort: Pulmonary effort is normal.  Skin:    General: Skin is warm and dry.     Comments: Scattered mildly pruritic macular papular rash on neck, chest, abdomen, bilateral forearms, hands, lower legs, feet.  No vesicles, swelling, drainage, warmth.  Neurological:     General: No focal deficit present.     Mental Status: He is alert and oriented to person, place, and time.  Psychiatric:        Mood and Affect: Mood normal.        Behavior: Behavior normal.      UC Treatments / Results  Labs (all labs ordered are listed, but only abnormal results are displayed) Labs Reviewed - No data to display  EKG   Radiology No results found.  Procedures Procedures (including critical care time)  Medications Ordered in UC Medications - No data to  display  Initial Impression / Assessment and Plan / UC Course  I have reviewed the triage vital signs and the nursing notes.  Pertinent labs & imaging results that were available during my care of the patient were reviewed by me and considered in my medical decision making (see chart for details).     Reviewed exam and symptoms with patient.  Reports no improvement with previous treatments of oral steroids.  Will do  trial of topical Kenalog cream as he reports topical cortisone cream tends to be the most beneficial for symptoms.  Advise he follow-up with dermatology for further evaluation and treatment of his rash.  Contact information provided Follow-up with PCP if symptoms do not improve ER precautions reviewed and patient verbalized understanding Final Clinical Impressions(s) / UC Diagnoses   Final diagnoses:  Rash and nonspecific skin eruption     Discharge Instructions      Schedule appoint with dermatology for further evaluation of your persistent rash Topical Kenalog cream to the affected rash areas twice daily as needed Please follow-up with your PCP if symptoms do not improve Please go to the ER for any worsening symptoms   ED Prescriptions     Medication Sig Dispense Auth. Provider   triamcinolone cream (KENALOG) 0.1 % Apply 1 Application topically 2 (two) times daily. 45 g Radford Pax, NP      PDMP not reviewed this encounter.   Radford Pax, NP 01/15/23 [phone]

## 2023-01-19 DIAGNOSIS — R21 Rash and other nonspecific skin eruption: Secondary | ICD-10-CM | POA: Diagnosis not present

## 2023-01-19 DIAGNOSIS — H6503 Acute serous otitis media, bilateral: Secondary | ICD-10-CM | POA: Diagnosis not present

## 2023-01-19 DIAGNOSIS — R509 Fever, unspecified: Secondary | ICD-10-CM | POA: Diagnosis not present

## 2023-01-27 ENCOUNTER — Other Ambulatory Visit (HOSPITAL_BASED_OUTPATIENT_CLINIC_OR_DEPARTMENT_OTHER): Payer: Self-pay

## 2023-01-27 MED ORDER — TRIAMCINOLONE ACETONIDE 0.1 % EX CREA
1.0000 | TOPICAL_CREAM | Freq: Two times a day (BID) | CUTANEOUS | 1 refills | Status: DC
  Filled 2023-01-27: qty 80, 40d supply, fill #0
  Filled 2023-03-03: qty 80, 40d supply, fill #1

## 2023-02-12 ENCOUNTER — Ambulatory Visit
Admission: RE | Admit: 2023-02-12 | Discharge: 2023-02-12 | Disposition: A | Discharge status: Home / Self Care | Payer: 59 | Source: Ambulatory Visit | Attending: Cardiology | Admitting: Cardiology

## 2023-02-12 DIAGNOSIS — I77819 Aortic ectasia, unspecified site: Secondary | ICD-10-CM

## 2023-02-12 DIAGNOSIS — R9389 Abnormal findings on diagnostic imaging of other specified body structures: Secondary | ICD-10-CM

## 2023-02-12 DIAGNOSIS — Q231 Congenital insufficiency of aortic valve: Secondary | ICD-10-CM

## 2023-02-12 DIAGNOSIS — I7121 Aneurysm of the ascending aorta, without rupture: Secondary | ICD-10-CM | POA: Diagnosis not present

## 2023-02-12 MED ORDER — IOPAMIDOL (ISOVUE-370) INJECTION 76%
75.0000 mL | Freq: Once | INTRAVENOUS | Status: AC | PRN
  Administered 2023-02-12: 75 mL via INTRAVENOUS

## 2023-05-12 ENCOUNTER — Encounter (INDEPENDENT_AMBULATORY_CARE_PROVIDER_SITE_OTHER): Payer: Self-pay

## 2023-05-27 ENCOUNTER — Other Ambulatory Visit: Payer: Self-pay

## 2023-05-27 ENCOUNTER — Encounter: Payer: Self-pay | Admitting: Internal Medicine

## 2023-05-27 ENCOUNTER — Other Ambulatory Visit: Payer: Self-pay | Admitting: Internal Medicine

## 2023-05-27 ENCOUNTER — Other Ambulatory Visit (HOSPITAL_BASED_OUTPATIENT_CLINIC_OR_DEPARTMENT_OTHER): Payer: Self-pay

## 2023-05-27 ENCOUNTER — Ambulatory Visit: Payer: 59 | Admitting: Internal Medicine

## 2023-05-27 VITALS — BP 122/76 | HR 55 | Temp 99.1°F | Ht 65.0 in | Wt 174.0 lb

## 2023-05-27 DIAGNOSIS — E559 Vitamin D deficiency, unspecified: Secondary | ICD-10-CM

## 2023-05-27 DIAGNOSIS — R7989 Other specified abnormal findings of blood chemistry: Secondary | ICD-10-CM | POA: Diagnosis not present

## 2023-05-27 DIAGNOSIS — F419 Anxiety disorder, unspecified: Secondary | ICD-10-CM | POA: Diagnosis not present

## 2023-05-27 DIAGNOSIS — F32A Depression, unspecified: Secondary | ICD-10-CM | POA: Diagnosis not present

## 2023-05-27 DIAGNOSIS — Z0000 Encounter for general adult medical examination without abnormal findings: Secondary | ICD-10-CM | POA: Diagnosis not present

## 2023-05-27 DIAGNOSIS — Z0001 Encounter for general adult medical examination with abnormal findings: Secondary | ICD-10-CM

## 2023-05-27 DIAGNOSIS — R739 Hyperglycemia, unspecified: Secondary | ICD-10-CM

## 2023-05-27 DIAGNOSIS — E785 Hyperlipidemia, unspecified: Secondary | ICD-10-CM

## 2023-05-27 DIAGNOSIS — E538 Deficiency of other specified B group vitamins: Secondary | ICD-10-CM

## 2023-05-27 DIAGNOSIS — Z125 Encounter for screening for malignant neoplasm of prostate: Secondary | ICD-10-CM | POA: Diagnosis not present

## 2023-05-27 LAB — CBC WITH DIFFERENTIAL/PLATELET
Basophils Absolute: 0 10*3/uL (ref 0.0–0.1)
Basophils Relative: 0.4 % (ref 0.0–3.0)
Eosinophils Absolute: 0.1 10*3/uL (ref 0.0–0.7)
Eosinophils Relative: 1.4 % (ref 0.0–5.0)
HCT: 49.8 % (ref 39.0–52.0)
Hemoglobin: 16.9 g/dL (ref 13.0–17.0)
Lymphocytes Relative: 28.1 % (ref 12.0–46.0)
Lymphs Abs: 2.1 10*3/uL (ref 0.7–4.0)
MCHC: 33.9 g/dL (ref 30.0–36.0)
MCV: 94.9 fl (ref 78.0–100.0)
Monocytes Absolute: 0.6 10*3/uL (ref 0.1–1.0)
Monocytes Relative: 8.4 % (ref 3.0–12.0)
Neutro Abs: 4.6 10*3/uL (ref 1.4–7.7)
Neutrophils Relative %: 61.7 % (ref 43.0–77.0)
Platelets: 211 10*3/uL (ref 150.0–400.0)
RBC: 5.25 Mil/uL (ref 4.22–5.81)
RDW: 13.8 % (ref 11.5–15.5)
WBC: 7.4 10*3/uL (ref 4.0–10.5)

## 2023-05-27 LAB — URINALYSIS, ROUTINE W REFLEX MICROSCOPIC
Bilirubin Urine: NEGATIVE
Hgb urine dipstick: NEGATIVE
Ketones, ur: NEGATIVE
Leukocytes,Ua: NEGATIVE
Nitrite: NEGATIVE
RBC / HPF: NONE SEEN (ref 0–?)
Specific Gravity, Urine: 1.015 (ref 1.000–1.030)
Total Protein, Urine: NEGATIVE
Urine Glucose: NEGATIVE
Urobilinogen, UA: 1 (ref 0.0–1.0)
pH: 7.5 (ref 5.0–8.0)

## 2023-05-27 LAB — HEPATIC FUNCTION PANEL
ALT: 36 U/L (ref 0–53)
AST: 23 U/L (ref 0–37)
Albumin: 4.8 g/dL (ref 3.5–5.2)
Alkaline Phosphatase: 53 U/L (ref 39–117)
Bilirubin, Direct: 0.2 mg/dL (ref 0.0–0.3)
Total Bilirubin: 1.1 mg/dL (ref 0.2–1.2)
Total Protein: 7.8 g/dL (ref 6.0–8.3)

## 2023-05-27 LAB — LIPID PANEL
Cholesterol: 247 mg/dL — ABNORMAL HIGH (ref 0–200)
HDL: 39.4 mg/dL (ref >39.00)
NonHDL: 207.85
Total CHOL/HDL Ratio: 6
Triglycerides: 247 mg/dL — ABNORMAL HIGH (ref 0.0–149.0)
VLDL: 49.4 mg/dL — ABNORMAL HIGH (ref 0.0–40.0)

## 2023-05-27 LAB — TSH: TSH: 2.38 u[IU]/mL (ref 0.35–5.50)

## 2023-05-27 LAB — BASIC METABOLIC PANEL
BUN: 11 mg/dL (ref 6–23)
CO2: 27 mEq/L (ref 19–32)
Calcium: 9.9 mg/dL (ref 8.4–10.5)
Chloride: 100 mEq/L (ref 96–112)
Creatinine, Ser: 1.09 mg/dL (ref 0.40–1.50)
GFR: 85.27 mL/min (ref >60.00)
Glucose, Bld: 65 mg/dL — ABNORMAL LOW (ref 70–99)
Potassium: 3.7 mEq/L (ref 3.5–5.1)
Sodium: 136 mEq/L (ref 135–145)

## 2023-05-27 LAB — PSA: PSA: 1.29 ng/mL (ref 0.10–4.00)

## 2023-05-27 LAB — HEMOGLOBIN A1C: Hgb A1c MFr Bld: 5 % (ref 4.6–6.5)

## 2023-05-27 LAB — VITAMIN B12: Vitamin B-12: 258 pg/mL (ref 211–911)

## 2023-05-27 LAB — LDL CHOLESTEROL, DIRECT: Direct LDL: 205 mg/dL

## 2023-05-27 LAB — VITAMIN D 25 HYDROXY (VIT D DEFICIENCY, FRACTURES): VITD: 23.35 ng/mL — ABNORMAL LOW (ref 30.00–100.00)

## 2023-05-27 MED ORDER — CLONAZEPAM 0.5 MG PO TABS
0.5000 mg | ORAL_TABLET | Freq: Two times a day (BID) | ORAL | 1 refills | Status: DC | PRN
  Filled 2023-05-27: qty 30, 15d supply, fill #0
  Filled 2023-07-29: qty 30, 15d supply, fill #1

## 2023-05-27 MED ORDER — ROSUVASTATIN CALCIUM 20 MG PO TABS
20.0000 mg | ORAL_TABLET | Freq: Every day | ORAL | 3 refills | Status: DC
  Filled 2023-05-27: qty 90, 90d supply, fill #0

## 2023-05-27 NOTE — Patient Instructions (Addendum)
Please take all new medication as prescribed - the clonazepam as needed  Please continue all other medications as before, and refills have been done if requested.  Please have the pharmacy call with any other refills you may need.  Please continue your efforts at being more active, low cholesterol diet, and weight control.  You are otherwise up to date with prevention measures today.  Please keep your appointments with your specialists as you may have planned - CT scans every 6 months  Please go to the LAB at the blood drawing area for the tests to be done  You will be contacted by phone if any changes need to be made immediately.  Otherwise, you will receive a letter about your results with an explanation, but please check with MyChart first.  Please make an Appointment to return for your 1 year visit, or sooner if needed

## 2023-05-27 NOTE — Progress Notes (Signed)
Patient ID: [name], male   DOB: November 26, 1980, 42 y.o.   MRN: [id number]         Chief Complaint:: wellness exam and hld, anxiety, low testosterone, allergies       HPI:  Donald Swanson is a 42 y.o. male here for wellness exam; declines covid bosoter, for tdap and flu shot at pharmacy, ow/ up to date                        Also saw dermatology after ED visit apri 2024 with rash - no definite diagnosis, pt declines biopsy. Pt denies chest pain, increased sob or doe, wheezing, orthopnea, PND, increased LE swelling, palpitations, dizziness or syncope.   Pt denies polydipsia, polyuria, or new focal neuro s/s.    Pt denies fever, wt loss, night sweats, loss of appetite, or other constitutional symptoms  Denies worsening depressive symptoms, suicidal ideation, or panic; has ongoing anxiety, increased recently    Wt Readings from Last 3 Encounters:  05/27/23 174 lb (78.9 kg)  10/01/22 180 lb 9.6 oz (81.9 kg)  09/24/22 181 lb 6.4 oz (82.3 kg)   BP Readings from Last 3 Encounters:  05/27/23 122/76  01/15/23 126/82  10/01/22 118/70   Immunization History  Administered Date(s) Administered   Influenza,inj,Quad PF,6+ Mos 07/02/2016   Influenza-Unspecified 07/12/2017, 08/12/2018, 07/13/2019, 08/08/2020   PFIZER(Purple Top)SARS-COV-2 Vaccination 05/16/2020, 06/06/2020   Tdap 02/23/2013   Health Maintenance Due  Topic Date Due   COVID-19 Vaccine (3 - 2023-24 season) 06/12/2022   DTaP/Tdap/Td (2 - Td or Tdap) 02/24/2023   INFLUENZA VACCINE  05/13/2023      Past Medical History:  Diagnosis Date   Hyperlipemia    Low testosterone    Past Surgical History:  Procedure Laterality Date   ETHMOIDECTOMY Bilateral 07/09/2020   Procedure: TOTAL ETHMOIDECTOMY;  Surgeon: Drema Halon, MD;  Location: [facility];  Service: ENT;  Laterality: Bilateral;   KNEE ARTHROSCOPY WITH MEDIAL MENISECTOMY Right 06/10/2015   Procedure: knee arthroscopy with medial menisectomy   ;  Surgeon: Gean Birchwood, MD;  Location: [facility];  Service: Orthopedics;  Laterality: Right;   MAXILLARY ANTROSTOMY Bilateral 07/09/2020   Procedure: ENDOSCOPIC MAXILLARY ANTROSTOMY;  Surgeon: Drema Halon, MD;  Location: [facility];  Service: ENT;  Laterality: Bilateral;   NASAL SEPTOPLASTY W/ TURBINOPLASTY Bilateral 07/09/2020   Procedure: NASAL SEPTOPLASTY WITH TURBINATE REDUCTION;  Surgeon: Drema Halon, MD;  Location: [facility];  Service: ENT;  Laterality: Bilateral;   NASAL SINUS SURGERY Bilateral 07/09/2020   Procedure: ENDOSCOPIC SINUS WITH BALLOON DILATION FRONTAL OSTIUM;  Surgeon: Drema Halon, MD;  Location: [facility];  Service: ENT;  Laterality: Bilateral;   SINUS ENDO WITH FUSION Bilateral 07/09/2020   Procedure: SINUS ENDOSCOPY WITH FUSION NAVIGATION;  Surgeon: Drema Halon, MD;  Location: [facility];  Service: ENT;  Laterality: Bilateral;    reports that he has quit smoking. His smoking use included cigarettes. He has a 2.3 pack-year smoking history. He has never used smokeless tobacco. He reports current alcohol use. He reports that he does not use drugs. family history includes Atrial fibrillation in his mother; COPD in his mother. No Known Allergies No current outpatient medications on file prior to visit.   No current facility-administered medications on file prior to visit.        ROS:  All others reviewed and negative.  Objective  PE:  BP 122/76 (BP Location: Left Arm, Patient Position: Sitting, Cuff Size: Normal)   Pulse (!) 55   Temp 99.1 F (37.3 C) (Oral)   Ht 5\' 5"  (1.651 m)   Wt 174 lb (78.9 kg)   SpO2 98%   BMI 28.96 kg/m                 Constitutional: Pt appears in NAD               HENT: Head: NCAT.                Right Ear: External ear normal.                 Left Ear: External ear normal.                Eyes: . Pupils are equal,  round, and reactive to light. Conjunctivae and EOM are normal               Nose: without d/c or deformity               Neck: Neck supple. Gross normal ROM               Cardiovascular: Normal rate and regular rhythm.                 Pulmonary/Chest: Effort normal and breath sounds without rales or wheezing.                Abd:  Soft, NT, ND, + BS, no organomegaly               Neurological: Pt is alert. At baseline orientation, motor grossly intact               Skin: Skin is warm. No rashes, no other new lesions, LE edema - none               Psychiatric: Pt behavior is normal without agitation   Micro: none  Cardiac tracings I have personally interpreted today:  none  Pertinent Radiological findings (summarize): none   Lab Results  Component Value Date   WBC 7.4 05/27/2023   HGB 16.9 05/27/2023   HCT 49.8 05/27/2023   PLT 211.0 05/27/2023   GLUCOSE 65 (L) 05/27/2023   CHOL 247 (H) 05/27/2023   TRIG 247.0 (H) 05/27/2023   HDL 39.40 05/27/2023   LDLDIRECT 205.0 05/27/2023   LDLCALC 117 (H) 02/11/2021   ALT 36 05/27/2023   AST 23 05/27/2023   NA 136 05/27/2023   K 3.7 05/27/2023   CL 100 05/27/2023   CREATININE 1.09 05/27/2023   BUN 11 05/27/2023   CO2 27 05/27/2023   TSH 2.38 05/27/2023   PSA 1.29 05/27/2023   HGBA1C 5.0 05/27/2023   Assessment/Plan:  Donald Swanson is a 42 y.o. White or Caucasian [1] male with  has a past medical history of Hyperlipemia and Low testosterone.  Encounter for well adult exam with abnormal findings Age and sex appropriate education and counseling updated with regular exercise and diet Referrals for preventative services - none needed Immunizations addressed - declines covid booster, for tdap and flu at pharmacy Smoking counseling  - none needed Evidence for depression or other mood disorder - none significant Most recent labs reviewed. I have personally reviewed and have noted: 1) the patient's medical and social history 2)  The patient's current medications and supplements 3) The patient's height, weight, and BMI have been recorded  in the chart   Low testosterone For f/u lab, stable  Hyperlipidemia Lab Results  Component Value Date   LDLCALC 117 (H) 02/11/2021   uncontrolled, pt for low chol diet, declines statin for now   Vitamin D deficiency Last vitamin D Lab Results  Component Value Date   VD25OH 23.35 (L) 05/27/2023   Low, to start oral replacement   Anxiety and depression Ok for klonopin 0.5 mg every day prn  Followup: Return in about 1 year (around 05/26/2024).  Oliver Barre, MD 05/31/2023 9:17 PM [facility]

## 2023-05-31 ENCOUNTER — Encounter: Payer: Self-pay | Admitting: Internal Medicine

## 2023-05-31 NOTE — Assessment & Plan Note (Signed)
Last vitamin D Lab Results  Component Value Date   VD25OH 23.35 (L) 05/27/2023   Low, to start oral replacement

## 2023-05-31 NOTE — Assessment & Plan Note (Signed)
Age and sex appropriate education and counseling updated with regular exercise and diet Referrals for preventative services - none needed Immunizations addressed - declines covid booster, for tdap and flu at pharmacy Smoking counseling  - none needed Evidence for depression or other mood disorder - none significant Most recent labs reviewed. I have personally reviewed and have noted: 1) the patient's medical and social history 2) The patient's current medications and supplements 3) The patient's height, weight, and BMI have been recorded in the chart

## 2023-05-31 NOTE — Assessment & Plan Note (Signed)
Lab Results  Component Value Date   LDLCALC 117 (H) 02/11/2021   uncontrolled, pt for low chol diet, declines statin for now

## 2023-05-31 NOTE — Assessment & Plan Note (Signed)
For f/u lab, stable

## 2023-05-31 NOTE — Assessment & Plan Note (Signed)
Ok for klonopin 0.5 mg every day prn

## 2023-07-29 ENCOUNTER — Encounter: Payer: Self-pay | Admitting: Internal Medicine

## 2023-07-29 ENCOUNTER — Other Ambulatory Visit (HOSPITAL_BASED_OUTPATIENT_CLINIC_OR_DEPARTMENT_OTHER): Payer: Self-pay

## 2023-07-29 ENCOUNTER — Other Ambulatory Visit: Payer: Self-pay

## 2023-07-29 MED ORDER — ATORVASTATIN CALCIUM 20 MG PO TABS
20.0000 mg | ORAL_TABLET | Freq: Every day | ORAL | 3 refills | Status: DC
  Filled 2023-07-29: qty 90, 90d supply, fill #0
  Filled 2024-02-22: qty 90, 90d supply, fill #1

## 2023-07-29 NOTE — Telephone Encounter (Signed)
Done erx

## 2023-10-28 ENCOUNTER — Other Ambulatory Visit: Payer: Self-pay | Admitting: Internal Medicine

## 2023-10-29 ENCOUNTER — Other Ambulatory Visit (HOSPITAL_BASED_OUTPATIENT_CLINIC_OR_DEPARTMENT_OTHER): Payer: Self-pay

## 2023-10-29 MED ORDER — CLONAZEPAM 0.5 MG PO TABS
0.5000 mg | ORAL_TABLET | Freq: Two times a day (BID) | ORAL | 1 refills | Status: DC | PRN
  Filled 2023-10-29: qty 30, 15d supply, fill #0
  Filled 2024-02-22: qty 30, 15d supply, fill #1

## 2024-01-04 ENCOUNTER — Encounter: Payer: Self-pay | Admitting: Emergency Medicine

## 2024-02-22 ENCOUNTER — Other Ambulatory Visit (HOSPITAL_BASED_OUTPATIENT_CLINIC_OR_DEPARTMENT_OTHER): Payer: Self-pay

## 2024-02-22 ENCOUNTER — Other Ambulatory Visit: Payer: Self-pay

## 2024-03-07 ENCOUNTER — Emergency Department (HOSPITAL_COMMUNITY)
Admission: EM | Admit: 2024-03-07 | Discharge: 2024-03-07 | Disposition: A | Discharge status: Home / Self Care | Attending: Emergency Medicine | Admitting: Emergency Medicine

## 2024-03-07 ENCOUNTER — Emergency Department (HOSPITAL_COMMUNITY)

## 2024-03-07 ENCOUNTER — Ambulatory Visit (HOSPITAL_BASED_OUTPATIENT_CLINIC_OR_DEPARTMENT_OTHER)
Admission: EM | Admit: 2024-03-07 | Discharge: 2024-03-07 | Disposition: A | Discharge status: Home / Self Care | Attending: Family Medicine | Admitting: Family Medicine

## 2024-03-07 ENCOUNTER — Other Ambulatory Visit: Payer: Self-pay

## 2024-03-07 ENCOUNTER — Other Ambulatory Visit (HOSPITAL_BASED_OUTPATIENT_CLINIC_OR_DEPARTMENT_OTHER): Payer: Self-pay

## 2024-03-07 ENCOUNTER — Encounter (HOSPITAL_BASED_OUTPATIENT_CLINIC_OR_DEPARTMENT_OTHER): Payer: Self-pay | Admitting: Emergency Medicine

## 2024-03-07 ENCOUNTER — Ambulatory Visit: Payer: Self-pay

## 2024-03-07 ENCOUNTER — Encounter (HOSPITAL_COMMUNITY): Payer: Self-pay

## 2024-03-07 DIAGNOSIS — R1013 Epigastric pain: Secondary | ICD-10-CM | POA: Diagnosis not present

## 2024-03-07 DIAGNOSIS — R079 Chest pain, unspecified: Secondary | ICD-10-CM | POA: Diagnosis not present

## 2024-03-07 DIAGNOSIS — R0789 Other chest pain: Secondary | ICD-10-CM | POA: Insufficient documentation

## 2024-03-07 DIAGNOSIS — R5383 Other fatigue: Secondary | ICD-10-CM | POA: Insufficient documentation

## 2024-03-07 DIAGNOSIS — R42 Dizziness and giddiness: Secondary | ICD-10-CM | POA: Diagnosis not present

## 2024-03-07 LAB — BASIC METABOLIC PANEL WITH GFR
Anion gap: 11 (ref 5–15)
BUN: 9 mg/dL (ref 6–20)
CO2: 27 mmol/L (ref 22–32)
Calcium: 9.7 mg/dL (ref 8.9–10.3)
Chloride: 101 mmol/L (ref 98–111)
Creatinine, Ser: 1.07 mg/dL (ref 0.61–1.24)
GFR, Estimated: >60 mL/min (ref >60)
Glucose, Bld: 95 mg/dL (ref 70–99)
Potassium: 3.8 mmol/L (ref 3.5–5.1)
Sodium: 139 mmol/L (ref 135–145)

## 2024-03-07 LAB — CBC WITH DIFFERENTIAL/PLATELET
Abs Immature Granulocytes: 0.01 10*3/uL (ref 0.00–0.07)
Basophils Absolute: 0 10*3/uL (ref 0.0–0.1)
Basophils Relative: 0 %
Eosinophils Absolute: 0.1 10*3/uL (ref 0.0–0.5)
Eosinophils Relative: 2 %
HCT: 49 % (ref 39.0–52.0)
Hemoglobin: 17 g/dL (ref 13.0–17.0)
Immature Granulocytes: 0 %
Lymphocytes Relative: 38 %
Lymphs Abs: 1.9 10*3/uL (ref 0.7–4.0)
MCH: 32.3 pg (ref 26.0–34.0)
MCHC: 34.7 g/dL (ref 30.0–36.0)
MCV: 93 fL (ref 80.0–100.0)
Monocytes Absolute: 0.3 10*3/uL (ref 0.1–1.0)
Monocytes Relative: 6 %
Neutro Abs: 2.6 10*3/uL (ref 1.7–7.7)
Neutrophils Relative %: 54 %
Platelets: 214 10*3/uL (ref 150–400)
RBC: 5.27 MIL/uL (ref 4.22–5.81)
RDW: 13.7 % (ref 11.5–15.5)
WBC: 4.9 10*3/uL (ref 4.0–10.5)
nRBC: 0 % (ref 0.0–0.2)

## 2024-03-07 LAB — TROPONIN I (HIGH SENSITIVITY)
Troponin I (High Sensitivity): 4 ng/L (ref <18)
Troponin I (High Sensitivity): 4 ng/L (ref <18)

## 2024-03-07 MED ORDER — PANTOPRAZOLE SODIUM 20 MG PO TBEC
20.0000 mg | DELAYED_RELEASE_TABLET | Freq: Every day | ORAL | 0 refills | Status: DC
  Filled 2024-03-07 (×3): qty 15, 15d supply, fill #0

## 2024-03-07 MED ORDER — PANTOPRAZOLE SODIUM 20 MG PO TBEC
20.0000 mg | DELAYED_RELEASE_TABLET | Freq: Every day | ORAL | 0 refills | Status: DC
Start: 2024-03-07 — End: 2024-03-07
  Filled 2024-03-07: qty 15, 15d supply, fill #0

## 2024-03-07 MED ORDER — LIDOCAINE VISCOUS HCL 2 % MT SOLN
15.0000 mL | Freq: Once | OROMUCOSAL | Status: AC
  Administered 2024-03-07: 15 mL via OROMUCOSAL

## 2024-03-07 MED ORDER — ALUM & MAG HYDROXIDE-SIMETH 200-200-20 MG/5ML PO SUSP
30.0000 mL | Freq: Once | ORAL | Status: AC
  Administered 2024-03-07: 30 mL via ORAL

## 2024-03-07 NOTE — ED Provider Notes (Signed)
 [place] EMERGENCY DEPARTMENT AT [facility] Provider Note   CSN: [id number] Arrival date & time: 03/07/24  1113     History  No chief complaint on file.  HPI Donald Swanson is a 43 y.o. male with history of bicuspid aortic valve, hyperlipidemia presenting for chest pain and fatigue.  Started 3 days ago.  Chest pain is central nonradiating.  Denies associated shortness of breath.  Chest pain not worse with exertion.  Reports that chest pain is sometimes worse if he does not eat a meal. Denies lower extremity edema.  Also mention that he has been more fatigued in the last few days.  Did mention that 4 days ago he was [place] the mountains.  States that this time chest pain is minimal.  Denies fever cough, nausea vomiting diarrhea.  Denies bloody stools.  HPI     Home Medications Prior to Admission medications   Medication Sig Start Date End Date Taking? Authorizing Provider  pantoprazole  (PROTONIX ) 20 MG tablet Take 1 tablet (20 mg total) by mouth daily. 03/07/24  Yes Janalee Mcmurray, PA-C  atorvastatin  (LIPITOR) 20 MG tablet Take 1 tablet (20 mg total) by mouth daily. 07/29/23 07/28/24  [name], MD  clonazePAM  (KLONOPIN ) 0.5 MG tablet Take 1 tablet (0.5 mg total) by mouth 2 (two) times daily as needed for anxiety. 10/29/23   Colene Dauphin, MD      Allergies    Patient has no known allergies.    Review of Systems   See HPI  Physical Exam Updated Vital Signs BP (!) 151/93   Pulse (!) 50   Temp 98.5 F (36.9 C) (Oral)   Resp (!) 21   SpO2 100%  Physical Exam Vitals and nursing note reviewed.  HENT:     Head: Normocephalic and atraumatic.     Mouth/Throat:     Mouth: Mucous membranes are moist.  Eyes:     General:        Right eye: No discharge.        Left eye: No discharge.     Conjunctiva/sclera: Conjunctivae normal.  Cardiovascular:     Rate and Rhythm: Normal rate and regular rhythm.     Pulses: Normal pulses.     Heart sounds: Normal  heart sounds.  Pulmonary:     Effort: Pulmonary effort is normal.     Breath sounds: Normal breath sounds.  Abdominal:     General: Abdomen is flat.     Palpations: Abdomen is soft.  Skin:    General: Skin is warm and dry.  Neurological:     General: No focal deficit present.  Psychiatric:        Mood and Affect: Mood normal.     ED Results / Procedures / Treatments   Labs (all labs ordered are listed, but only abnormal results are displayed) Labs Reviewed  BASIC METABOLIC PANEL WITH GFR  CBC WITH DIFFERENTIAL/PLATELET  TROPONIN I (HIGH SENSITIVITY)  TROPONIN I (HIGH SENSITIVITY)    EKG None  Radiology DG Chest 2 View Result Date: 03/07/2024 CLINICAL DATA:  Chest pain. EXAM: CHEST - 2 VIEW COMPARISON:  None Available. FINDINGS: The heart size and mediastinal contours are within normal limits. Both lungs are clear. The visualized skeletal structures are unremarkable. IMPRESSION: No active cardiopulmonary disease. Electronically Signed   By: Angus Bark M.D.   On: 03/07/2024 15:46    Procedures Procedures    Medications Ordered in ED Medications - No data to  display  ED Course/ Medical Decision Making/ A&P             HEART Score: 1                    Medical Decision Making  Initial Impression and Ddx 43 year old well-appearing male presenting for chest pain and fatigue.  Exam was unremarkable.  DDx includes ACS, PE, symptomatic anemia, pneumothorax, CHF exacerbation, electrolyte derangement, other. Patient PMH that increases complexity of ED encounter:  bicuspid aortic valve  Interpretation of Diagnostics - I independent reviewed and interpreted the labs as followed: no acute findings  - I independently visualized the following imaging with scope of interpretation limited to determining acute life threatening conditions related to emergency care: CXR, which revealed no acute findings  - I personally reviewed and interpreted EKG which revealed sinus  bradycardia (HR 57)  Patient Reassessment and Ultimate Disposition/Management Chest pain resolved without intervention.  Workup reassuring.  Does not suggest PE or ACS or CHF.  Suspect reflux could be contributing to his chest pain.  Sent a 15-day course of PPI to his pharmacy.  Advised him to follow-up with his PCP for his atypical chest pain and fatigue. Discharged in good condition.  Patient management required discussion with the following services or consulting groups:  None  Complexity of Problems Addressed Acute complicated illness or Injury  Additional Data Reviewed and Analyzed Further history obtained from: Further history from spouse/family member, Past medical history and medications listed in the EMR, and Prior ED visit notes  Patient Encounter Risk Assessment Prescriptions         Final Clinical Impression(s) / ED Diagnoses Final diagnoses:  Atypical chest pain  Other fatigue    Rx / DC Orders ED Discharge Orders          Ordered    pantoprazole (PROTONIX) 20 MG tablet  Daily        03/07/24 1550              Janalee Mcmurray, PA-C 03/07/24 1555    Iva Mariner, MD 03/08/24 [phone]

## 2024-03-07 NOTE — Discharge Instructions (Addendum)
 ECG showed sinus bradycardia with a heart rate of 59.  He has not had blood work in over a year.  Labs are pending to workup his fatigue.  He had some relief of his chest pain with the GI cocktail.  Orthostatic pulse and pressures were normal.  No specifics found to be causing his fatigue dizziness or chest pain.  Encouraged to follow-up with primary care or cardiology.  Follow-up here as needed.

## 2024-03-07 NOTE — ED Provider Triage Note (Signed)
 Emergency Medicine Provider Triage Evaluation Note  Donald Swanson , a 43 y.o. male  was evaluated in triage.  Pt complains of chest pain and fatigue.  He was doing some hiking this weekend and felt a little more short of breath.  For the last 2 days he has had some intermittent pain in the center of his chest.  It is now more constant.  It does not seem to get worse with exertion currently.  He has been feeling more fatigued and a little bit short of breath.  No cough or cold symptoms..  Review of Systems  Positive: Chest tightness, fatigue, shortness of breath Negative: Cough, fevers, leg swelling  Physical Exam  BP (!) 151/93 (BP Location: Right Arm)   Pulse (!) 54   Temp 98 F (36.7 C) (Oral)   Resp 16   SpO2 100%  Gen:   Awake, no distress    Resp:  Normal effort   MSK:   Moves extremities without difficulty   Other:     Medical Decision Making  Medically screening exam initiated at 11:47 AM.  Appropriate orders placed.  Donald Swanson was informed that the remainder of the evaluation will be completed by another provider, this initial triage assessment does not replace that evaluation, and the importance of remaining in the ED until their evaluation is complete.      Donald Los, MD 03/07/24 [phone]

## 2024-03-07 NOTE — Telephone Encounter (Signed)
 Copied from CRM [id number]. Topic: Clinical - Red Word Triage >> Mar 07, 2024  9:51 AM Donald Swanson wrote: Red Word that prompted transfer to Nurse Triage: patient has chest pain and dizziness and feeling tired as well    Chief Complaint: Chest Pain Symptoms: pressure in the middle, fatigue, light headedness Frequency: past 2 nights,  Pertinent Negatives: Patient denies dizziness, nausea, vomiting, sweating, fever, difficulty breathing, cough Disposition: [x] ED /[] Urgent Care (no appt availability in office) / [] Appointment(In office/virtual)/ []  [facility]/ [] Home Care/ [] Refused Recommended Disposition /[] [facility]/ []  Follow-up with PCP Additional Notes: Patient called and advised that he has been having some chest pressure for the past two nights. Patient states that he went to the [facility] and hiked uphill and he felt fine afterwards but when he woke up the next day he felt bad. Patient denies dizziness, nausea, vomiting, sweating, fever, difficulty breathing, cough. Patient did say that he felt light headed at this time. He was also very tired and states he usually isn't this tired. Patient denies dizziness, nausea, vomiting, Patient went to the Urgent Care this morning prior to calling and said he was advised to follow up with his PCP. Patient states that he was worried about his heart valves because he usually was having periodic CT scans with contrast to evaluate those. Patient was advised that the recommendation at this time for his symptoms was for him to be seen at the Emergency Room. The Urgent Care did some lab work but stated that it would be a few days before those results were back. Patient verbalized understanding and states that he will go to the Emergency Room at this time.     Reason for Disposition  Dizziness or lightheadedness  Answer Assessment - Initial Assessment Questions 1. LOCATION: "Where does it hurt?"       Middle of chest 2.  RADIATION: "Does the pain go anywhere else?" (e.g., into neck, jaw, arms, back)     No 3. ONSET: "When did the chest pain begin?" (Minutes, hours or days)      2 nights ago 4. PATTERN: "Does the pain come and go, or has it been constant since it started?"  "Does it get worse with exertion?"      Constant  5. DURATION: "How long does it last" (e.g., seconds, minutes, hours)     Pretty constant 6. SEVERITY: "How bad is the pain?"  (e.g., Scale 1-10; mild, moderate, or severe)    - MILD (1-3): doesn't interfere with normal activities     - MODERATE (4-7): interferes with normal activities or awakens from sleep    - SEVERE (8-10): excruciating pain, unable to do any normal activities       4-5 7. CARDIAC RISK FACTORS: "Do you have any history of heart problems or risk factors for heart disease?" (e.g., angina, prior heart attack; diabetes, high blood pressure, high cholesterol, smoker, or strong family history of heart disease)     Heart abnormalities and a murmur 8. PULMONARY RISK FACTORS: "Do you have any history of lung disease?"  (e.g., blood clots in lung, asthma, emphysema, birth control pills)     No 9. CAUSE: "What do you think is causing the chest pain?"     Unsure 10. OTHER SYMPTOMS: "Do you have any other symptoms?" (e.g., dizziness, nausea, vomiting, sweating, fever, difficulty breathing, cough)       Light headedness  Protocols used: Chest Pain-A-AH

## 2024-03-07 NOTE — ED Provider Notes (Signed)
 [facility]    CSN: [id number] Arrival date & time: 03/07/24  0815      History   Chief Complaint No chief complaint on file.   HPI Donald Swanson is a 43 y.o. male.   Patient reports that he was in the mountains over the weekend.  On Saturday, 03/04/2024, he and his family were hiking and they were doing a lot of uphill and he was a little short of breath and had a little central chest pain.  He was not diaphoretic.  On Sunday, 03/05/2024, he was super fatigued and tired and felt intermittently lightheaded.  He had some very mild central chest pain.  He has some mild heartburn at times but this does not feel like heartburn.  He is having bowel movements once or twice daily.  He denies nausea, vomiting, diarrhea, constipation.  He did not have chest pain that radiated to the left or right arm nor the jaw.  He was not diaphoretic.  He did not have palpitations.  He tends to run a heart rate in the 50s or 60s.  He has aortic and mitral valve abnormalities and a murmur.  He always worries that his heart is having trouble.  He reports that he sometimes faints with blood work or just feels lightheaded and has pseudo that kind of hot feeling in general.  He does not think he has had any blood work since his physical over a year ago.     Past Medical History:  Diagnosis Date   Hyperlipemia    Low testosterone      Patient Active Problem List   Diagnosis Date Noted   Pulmonary nodules/lesions, multiple 09/24/2022   Acquired dilation of ascending aorta and aortic root (HCC) 07/22/2022   Mild aortic insufficiency 06/16/2022   Bicuspid aortic valve 06/16/2022   Heart murmur 05/25/2022   Migraine headache 04/04/2021   Vitamin D  deficiency 02/18/2021   Bradycardia 02/18/2021   Anxiety and depression 10/18/2018   Rash 04/11/2018   Allergic rhinitis 01/27/2018   Irritable bowel syndrome (IBS) 01/27/2018   Fatigue 10/13/2017   Sleep disturbance 10/13/2017   Snoring 10/13/2017    Deviated nasal septum 10/13/2017   Chronic tonsillitis 10/13/2017   Polycythemia 10/13/2017   Encounter for well adult exam with abnormal findings 05/14/2016   Left carpal tunnel syndrome 03/10/2016   Low testosterone  12/17/2015   Hyperlipidemia 12/17/2015   Sinusitis, acute 12/17/2015    Past Surgical History:  Procedure Laterality Date   ETHMOIDECTOMY Bilateral 07/09/2020   Procedure: TOTAL ETHMOIDECTOMY;  Surgeon: Prescott Brodie, MD;  Location: [facility];  Service: ENT;  Laterality: Bilateral;   KNEE ARTHROSCOPY WITH MEDIAL MENISECTOMY Right 06/10/2015   Procedure: knee arthroscopy with medial menisectomy  ;  Surgeon: Wendolyn Hamburger, MD;  Location: [facility];  Service: Orthopedics;  Laterality: Right;   MAXILLARY ANTROSTOMY Bilateral 07/09/2020   Procedure: ENDOSCOPIC MAXILLARY ANTROSTOMY;  Surgeon: Prescott Brodie, MD;  Location: [facility];  Service: ENT;  Laterality: Bilateral;   NASAL SEPTOPLASTY W/ TURBINOPLASTY Bilateral 07/09/2020   Procedure: NASAL SEPTOPLASTY WITH TURBINATE REDUCTION;  Surgeon: Prescott Brodie, MD;  Location: [facility];  Service: ENT;  Laterality: Bilateral;   NASAL SINUS SURGERY Bilateral 07/09/2020   Procedure: ENDOSCOPIC SINUS WITH BALLOON DILATION FRONTAL OSTIUM;  Surgeon: Prescott Brodie, MD;  Location:  SURGERY C[facility];  Service: ENT;  Laterality: Bilateral;   SINUS ENDO WITH FUSION Bilateral 07/09/2020   Procedure:  SINUS ENDOSCOPY WITH FUSION NAVIGATION;  Surgeon: Prescott Brodie, MD;  Location: [facility];  Service: ENT;  Laterality: Bilateral;       Home Medications    Prior to Admission medications   Medication Sig Start Date End Date Taking? Authorizing Provider  atorvastatin  (LIPITOR) 20 MG tablet Take 1 tablet (20 mg total) by mouth daily. 07/29/23 07/28/24 Yes [name], MD  clonazePAM  (KLONOPIN ) 0.5 MG tablet Take 1 tablet  (0.5 mg total) by mouth 2 (two) times daily as needed for anxiety. 10/29/23  Yes Burns, Beckey Bourgeois, MD    Family History Family History  Problem Relation Age of Onset   COPD Mother    Atrial fibrillation Mother     Social History Social History   Tobacco Use   Smoking status: Former    Current packs/day: 0.25    Average packs/day: 0.3 packs/day for 9.0 years (2.3 ttl pk-yrs)    Types: Cigarettes   Smokeless tobacco: Never  Vaping Use   Vaping status: Never Used  Substance Use Topics   Alcohol use: Yes    Comment: occas   Drug use: No     Allergies   Patient has no known allergies.   Review of Systems Review of Systems  Constitutional:  Positive for fatigue. Negative for chills, diaphoresis and fever.  HENT:  Negative for ear pain and sore throat.   Eyes:  Negative for pain and visual disturbance.  Respiratory:  Positive for chest tightness and shortness of breath. Negative for cough.   Cardiovascular:  Positive for chest pain. Negative for palpitations.  Gastrointestinal:  Negative for abdominal pain, constipation, diarrhea, nausea and vomiting.  Genitourinary:  Negative for dysuria and hematuria.  Musculoskeletal:  Negative for arthralgias and back pain.  Skin:  Negative for color change and rash.  Neurological:  Positive for dizziness, weakness and light-headedness. Negative for seizures and syncope.  Psychiatric/Behavioral:  The patient is nervous/anxious.   All other systems reviewed and are negative.    Physical Exam Triage Vital Signs ED Triage Vitals  Encounter Vitals Group     BP 03/07/24 0822 132/85     Systolic BP Percentile --      Diastolic BP Percentile --      Pulse Rate 03/07/24 0822 (!) 55     Resp 03/07/24 0822 18     Temp 03/07/24 0822 97.9 F (36.6 C)     Temp Source 03/07/24 0822 Oral     SpO2 03/07/24 0822 98 %     Weight --      Height --      Head Circumference --      Peak Flow --      Pain Score 03/07/24 0821 5     Pain Loc --       Pain Education --      Exclude from Growth Chart --    Orthostatic VS for the past 24 hrs:  BP- Lying Pulse- Lying BP- Sitting Pulse- Sitting BP- Standing at 0 minutes Pulse- Standing at 0 minutes  03/07/24 0910 132/78 57 (!) 149/91 63 134/86 58    Updated Vital Signs BP 132/85 (BP Location: Right Arm)   Pulse (!) 55   Temp 97.9 F (36.6 C) (Oral)   Resp 18   SpO2 98%   Visual Acuity Right Eye Distance:   Left Eye Distance:   Bilateral Distance:    Right Eye Near:   Left Eye Near:    Bilateral Near:  Physical Exam Vitals and nursing note reviewed.  Constitutional:      General: He is not in acute distress.    Appearance: He is well-developed. He is not ill-appearing or toxic-appearing.  HENT:     Head: Normocephalic and atraumatic.     Right Ear: Hearing, tympanic membrane, ear canal and external ear normal.     Left Ear: Hearing, tympanic membrane, ear canal and external ear normal.     Nose: No congestion or rhinorrhea.     Right Sinus: No maxillary sinus tenderness or frontal sinus tenderness.     Left Sinus: No maxillary sinus tenderness or frontal sinus tenderness.     Mouth/Throat:     Lips: Pink.     Mouth: Mucous membranes are moist.     Pharynx: Uvula midline. No oropharyngeal exudate or posterior oropharyngeal erythema.     Tonsils: No tonsillar exudate.  Eyes:     Conjunctiva/sclera: Conjunctivae normal.     Pupils: Pupils are equal, round, and reactive to light.  Cardiovascular:     Rate and Rhythm: Normal rate and regular rhythm.     Heart sounds: S1 normal and S2 normal. Murmur heard.     Systolic murmur is present with a grade of 2/6.  Pulmonary:     Effort: Pulmonary effort is normal. No respiratory distress.     Breath sounds: Normal breath sounds. No decreased breath sounds, wheezing, rhonchi or rales.  Abdominal:     General: Bowel sounds are normal.     Palpations: Abdomen is soft.     Tenderness: There is abdominal tenderness (Mild)  in the epigastric area. There is no right CVA tenderness, left CVA tenderness, guarding or rebound. Negative signs include Murphy's sign, Rovsing's sign and McBurney's sign.  Musculoskeletal:        General: No swelling.     Cervical back: Neck supple.  Lymphadenopathy:     Head:     Right side of head: No submental, submandibular, tonsillar, preauricular or posterior auricular adenopathy.     Left side of head: No submental, submandibular, tonsillar, preauricular or posterior auricular adenopathy.     Cervical: No cervical adenopathy.     Right cervical: No superficial cervical adenopathy.    Left cervical: No superficial cervical adenopathy.  Skin:    General: Skin is warm and dry.     Capillary Refill: Capillary refill takes less than 2 seconds.     Findings: No rash.  Neurological:     Mental Status: He is alert and oriented to person, place, and time.  Psychiatric:        Mood and Affect: Mood normal.      UC Treatments / Results  Labs (all labs ordered are listed, but only abnormal results are displayed) Labs Reviewed  CBC WITH DIFFERENTIAL/PLATELET  COMPREHENSIVE METABOLIC PANEL WITH GFR  TSH  T4, FREE    EKG   Radiology No results found.  Procedures ED EKG  Date/Time: 03/07/2024 8:32 AM  Performed by: Guss Legacy, FNP Authorized by: Guss Legacy, FNP   ECG interpreted by ED Physician in the absence of a cardiologist: yes Eldonna Greenspan, FNP)   Previous ECG:    Previous ECG:  Compared to current (07/23/2022)   Similarity:  No change Interpretation:    Interpretation: normal   Rate:    ECG rate:  59   ECG rate assessment: bradycardic   Rhythm:    Rhythm: sinus rhythm   Ectopy:    Ectopy: none  QRS:    QRS axis:  Left   QRS intervals:  Normal   QRS conduction: normal   ST segments:    ST segments:  Normal T waves:    T waves: normal   Q waves:    Abnormal Q-waves: not present    (including critical care time)  Medications Ordered in  UC Medications  lidocaine  (XYLOCAINE ) 2 % viscous mouth solution 15 mL (15 mLs Mouth/Throat Given 03/07/24 0844)  alum & mag hydroxide-simeth (MAALOX/MYLANTA) 200-200-20 MG/5ML suspension 30 mL (30 mLs Oral Given 03/07/24 0844)    Initial Impression / Assessment and Plan / UC Course  I have reviewed the triage vital signs and the nursing notes.  Pertinent labs & imaging results that were available during my care of the patient were reviewed by me and considered in my medical decision making (see chart for details).  Plan of Care: Some improvement of epigastric pain or chest pain after GI cocktail.  Otherwise, his workup was negative (ECG normal, orthostatics negative).  Labs are pending.  Will adjust the plan of care if needed once labs result.  Encouraged to follow-up with family practice regarding symptoms of chest pain, fatigue, dizziness.  Follow-up here as needed.  I reviewed the plan of care with the patient and/or the patient's guardian.  The patient and/or guardian had time to ask questions and acknowledged that the questions were answered.  I provided instruction on symptoms or reasons to return here or to go to an ER, if symptoms/condition did not improve, worsened or if new symptoms occurred.  Final Clinical Impressions(s) / UC Diagnoses   Final diagnoses:  Chest pain, unspecified type  Abdominal pain, epigastric  Other fatigue  Dizziness     Discharge Instructions      ECG showed sinus bradycardia with a heart rate of 59.  He has not had blood work in over a year.  Labs are pending to workup his fatigue.  He had some relief of his chest pain with the GI cocktail.  Orthostatic pulse and pressures were normal.  No specifics found to be causing his fatigue dizziness or chest pain.  Encouraged to follow-up with primary care or cardiology.  Follow-up here as needed.   ED Prescriptions   None    PDMP not reviewed this encounter.   Guss Legacy, FNP 03/07/24 [phone]

## 2024-03-07 NOTE — Discharge Instructions (Signed)
 Evaluation for your chest pain and fatigue was overall reassuring.  I am sending Protonix to your pharmacy as your chest pain could be related to reflux.  Please follow-up with your PCP and cardiologist.

## 2024-03-07 NOTE — ED Triage Notes (Signed)
 Pt reports on Sunday he was dizzy lightheaded and chest tightness, he went hiking on Saturday in the mountains.

## 2024-03-07 NOTE — ED Triage Notes (Signed)
 Pt c/o central cp and fatigue x 3 days; described as pressure; denies n/v, denies sob; endorses some lightheadedness; sent from urgent care for further evaluation  Verbal consent given for mse

## 2024-03-08 ENCOUNTER — Telehealth: Payer: Self-pay | Admitting: Cardiology

## 2024-03-08 ENCOUNTER — Ambulatory Visit (HOSPITAL_COMMUNITY): Payer: Self-pay

## 2024-03-08 LAB — TSH: TSH: 3.34 u[IU]/mL (ref 0.450–4.500)

## 2024-03-08 LAB — COMPREHENSIVE METABOLIC PANEL WITH GFR
ALT: 44 IU/L (ref 0–44)
AST: 27 IU/L (ref 0–40)
Albumin: 4.7 g/dL (ref 4.1–5.1)
Alkaline Phosphatase: 60 IU/L (ref 44–121)
BUN/Creatinine Ratio: 9 (ref 9–20)
BUN: 10 mg/dL (ref 6–24)
Bilirubin Total: 0.9 mg/dL (ref 0.0–1.2)
CO2: 21 mmol/L (ref 20–29)
Calcium: 9.8 mg/dL (ref 8.7–10.2)
Chloride: 103 mmol/L (ref 96–106)
Creatinine, Ser: 1.14 mg/dL (ref 0.76–1.27)
Globulin, Total: 2.8 g/dL (ref 1.5–4.5)
Glucose: 98 mg/dL (ref 70–99)
Potassium: 4 mmol/L (ref 3.5–5.2)
Sodium: 140 mmol/L (ref 134–144)
Total Protein: 7.5 g/dL (ref 6.0–8.5)
eGFR: 83 mL/min/{1.73_m2} (ref >59)

## 2024-03-08 LAB — CBC WITH DIFFERENTIAL/PLATELET
Basophils Absolute: 0 10*3/uL (ref 0.0–0.2)
Basos: 0 %
EOS (ABSOLUTE): 0.2 10*3/uL (ref 0.0–0.4)
Eos: 4 %
Hematocrit: 49.3 % (ref 37.5–51.0)
Hemoglobin: 16.8 g/dL (ref 13.0–17.7)
Immature Grans (Abs): 0 10*3/uL (ref 0.0–0.1)
Immature Granulocytes: 0 %
Lymphocytes Absolute: 2.1 10*3/uL (ref 0.7–3.1)
Lymphs: 38 %
MCH: 32.8 pg (ref 26.6–33.0)
MCHC: 34.1 g/dL (ref 31.5–35.7)
MCV: 96 fL (ref 79–97)
Monocytes Absolute: 0.4 10*3/uL (ref 0.1–0.9)
Monocytes: 8 %
Neutrophils Absolute: 2.7 10*3/uL (ref 1.4–7.0)
Neutrophils: 50 %
Platelets: 213 10*3/uL (ref 150–450)
RBC: 5.12 x10E6/uL (ref 4.14–5.80)
RDW: 13.7 % (ref 11.6–15.4)
WBC: 5.4 10*3/uL (ref 3.4–10.8)

## 2024-03-08 LAB — T4, FREE: Free T4: 1.29 ng/dL (ref 0.82–1.77)

## 2024-03-08 NOTE — Telephone Encounter (Signed)
 error

## 2024-03-08 NOTE — Progress Notes (Unsigned)
 Cardiology Office Note:   Date:  03/09/2024  ID:  Donald Swanson, DOB [birth year], MRN [id number] PCP: [name], MD  [facility] Providers Cardiologist:  Eilleen Grates, MD Sleep Medicine:  Gaylyn Keas, MD {  History of Present Illness:   Donald Swanson is a 43 y.o. male who presents for presents for evaluation of a heart murmur.  He is referred by [name], MD.  He was sent for an echo in 2023 was found to have a bicuspid aortic valve.   CT demonstrates an aortic root of 45 mmHg.   He was in the ED 03/07/24 for chest pain.  I reviewed these records for this visit.  There was no objective evidence of ischemia.     He said that he went to the mountains last weekend.  He was walking up a hill and he was short of breath but he said this was unusual for him.  This was on Saturday.  The next day he also tried to walk up some incline and he felt little more fatigued and flushed in his arms and his head.  He has a little bit of chest tightness.  He felt very tired when he came back to town and on that Monday was fatigued.  He finally presented to the emergency room with results as above.  Chest x-ray was unremarkable.  He had otherwise been feeling fine at that point.  Not been having any cough fevers or chills.  He is not having any leg pain or cramping.  He not having any PND or orthopnea.   ROS: As stated in the HPI and negative for all other systems.  Studies Reviewed:    EKG:     Sinus rhythm, rate 57, axis within normal limits, short PR interval, no acute ST-T wave changes.  03/07/2024.    Risk Assessment/Calculations:              Physical Exam:   VS:  BP 133/83   Pulse (!) 56   Ht 5\' 5"  (1.651 m)   Wt 177 lb 6.4 oz (80.5 kg)   SpO2 95%   BMI 29.52 kg/m    Wt Readings from Last 3 Encounters:  03/09/24 177 lb 6.4 oz (80.5 kg)  05/27/23 174 lb (78.9 kg)  10/01/22 180 lb 9.6 oz (81.9 kg)     GEN: Well nourished, well developed in no acute  distress NECK: No JVD; No carotid bruits CARDIAC: RRR, opening snap, 2 out of 6 apical systolic murmur radiating slightly at the aortic outflow tract and early peaking, no diastolic murmurs, rubs, gallops RESPIRATORY:  Clear to auscultation without rales, wheezing or rhonchi  ABDOMEN: Soft, non-tender, non-distended EXTREMITIES:  No edema; No deformity   ASSESSMENT AND PLAN:    Precordial chest pain: This is nonanginal sounding.  It did take a long drive when he had some residual shortness of breath and chest tightness.  I like to rule out pulm embolism.  I would like to look at his aorta at the same time so we will see if we get a CT to encompass both.    Bicuspid aortic valve:  There was no evidence of stenosis on echo in 2023.  If the CT comes back normal I will have a low threshold for echo to evaluate.      Aortic root dilatation:   This was 45 mm in May 2024.  This will be assessed as above.    Sleep apnea:  He is being managed by Dr. Micael Adas.  He could not wear CPAP.  He is not interested in [place].  I told him this probably explains at least some of his fatigue.  Follow up with me in one year or sooner pending the results of the above.   Signed, Eilleen Grates, MD

## 2024-03-09 ENCOUNTER — Ambulatory Visit (INDEPENDENT_AMBULATORY_CARE_PROVIDER_SITE_OTHER): Admitting: Cardiology

## 2024-03-09 ENCOUNTER — Encounter: Payer: Self-pay | Admitting: Cardiology

## 2024-03-09 ENCOUNTER — Ambulatory Visit
Admission: RE | Admit: 2024-03-09 | Discharge: 2024-03-09 | Disposition: A | Discharge status: Home / Self Care | Source: Ambulatory Visit | Attending: Cardiology | Admitting: Cardiology

## 2024-03-09 VITALS — BP 133/83 | HR 56 | Ht 65.0 in | Wt 177.4 lb

## 2024-03-09 DIAGNOSIS — R079 Chest pain, unspecified: Secondary | ICD-10-CM | POA: Diagnosis not present

## 2024-03-09 DIAGNOSIS — I77810 Thoracic aortic ectasia: Secondary | ICD-10-CM | POA: Insufficient documentation

## 2024-03-09 DIAGNOSIS — G4733 Obstructive sleep apnea (adult) (pediatric): Secondary | ICD-10-CM

## 2024-03-09 DIAGNOSIS — Q2381 Bicuspid aortic valve: Secondary | ICD-10-CM | POA: Insufficient documentation

## 2024-03-09 DIAGNOSIS — I7121 Aneurysm of the ascending aorta, without rupture: Secondary | ICD-10-CM | POA: Diagnosis not present

## 2024-03-09 DIAGNOSIS — R0602 Shortness of breath: Secondary | ICD-10-CM | POA: Diagnosis not present

## 2024-03-09 DIAGNOSIS — R072 Precordial pain: Secondary | ICD-10-CM | POA: Diagnosis not present

## 2024-03-09 MED ORDER — IOHEXOL 350 MG/ML SOLN
100.0000 mL | Freq: Once | INTRAVENOUS | Status: AC | PRN
  Administered 2024-03-09: 100 mL via INTRAVENOUS

## 2024-03-09 NOTE — Patient Instructions (Signed)
 Medication Instructions:   No changes *If you need a refill on your cardiac medications before your next appointment, please call your pharmacy*   Lab Work: Not needed    Testing/Procedures: Non-Cardiac CT Angiography (CTA) look for Pulmonary embolus and aorta, is a special type of CT scan that uses a computer to produce multi-dimensional views of major blood vessels throughout the body. In CT angiography, a contrast material is injected through an IV to help visualize the blood vessels   Follow-Up: At [facility], you and your health needs are our priority.  As part of our continuing mission to provide you with exceptional heart care, we have created designated Provider Care Teams.  These Care Teams include your primary Cardiologist (physician) and Advanced Practice Providers (APPs -  Physician Assistants and Nurse Practitioners) who all work together to provide you with the care you need, when you need it.     Your next appointment:   12 month(s)  The format for your next appointment:   In Person  Provider:   Eilleen Grates, MD

## 2024-03-10 ENCOUNTER — Ambulatory Visit: Payer: Self-pay | Admitting: Cardiology

## 2024-03-10 DIAGNOSIS — R072 Precordial pain: Secondary | ICD-10-CM

## 2024-03-10 DIAGNOSIS — I77810 Thoracic aortic ectasia: Secondary | ICD-10-CM

## 2024-03-10 DIAGNOSIS — G4733 Obstructive sleep apnea (adult) (pediatric): Secondary | ICD-10-CM

## 2024-03-10 DIAGNOSIS — Q2381 Bicuspid aortic valve: Secondary | ICD-10-CM

## 2024-03-10 NOTE — Progress Notes (Signed)
 Normal.  Patient updated.

## 2024-03-14 ENCOUNTER — Ambulatory Visit: Admitting: Internal Medicine

## 2024-03-14 ENCOUNTER — Ambulatory Visit: Payer: Self-pay | Admitting: Internal Medicine

## 2024-03-14 ENCOUNTER — Other Ambulatory Visit (HOSPITAL_BASED_OUTPATIENT_CLINIC_OR_DEPARTMENT_OTHER): Payer: Self-pay

## 2024-03-14 ENCOUNTER — Other Ambulatory Visit: Payer: Self-pay | Admitting: Internal Medicine

## 2024-03-14 ENCOUNTER — Encounter: Payer: Self-pay | Admitting: Internal Medicine

## 2024-03-14 VITALS — BP 110/70 | HR 47 | Temp 98.6°F | Ht 65.0 in | Wt 177.0 lb

## 2024-03-14 DIAGNOSIS — R079 Chest pain, unspecified: Secondary | ICD-10-CM

## 2024-03-14 DIAGNOSIS — Z125 Encounter for screening for malignant neoplasm of prostate: Secondary | ICD-10-CM

## 2024-03-14 DIAGNOSIS — Z0001 Encounter for general adult medical examination with abnormal findings: Secondary | ICD-10-CM

## 2024-03-14 DIAGNOSIS — G8929 Other chronic pain: Secondary | ICD-10-CM | POA: Diagnosis not present

## 2024-03-14 DIAGNOSIS — F419 Anxiety disorder, unspecified: Secondary | ICD-10-CM | POA: Diagnosis not present

## 2024-03-14 DIAGNOSIS — R7989 Other specified abnormal findings of blood chemistry: Secondary | ICD-10-CM

## 2024-03-14 DIAGNOSIS — F32A Depression, unspecified: Secondary | ICD-10-CM

## 2024-03-14 DIAGNOSIS — E785 Hyperlipidemia, unspecified: Secondary | ICD-10-CM

## 2024-03-14 DIAGNOSIS — M25512 Pain in left shoulder: Secondary | ICD-10-CM | POA: Diagnosis not present

## 2024-03-14 DIAGNOSIS — E559 Vitamin D deficiency, unspecified: Secondary | ICD-10-CM | POA: Diagnosis not present

## 2024-03-14 DIAGNOSIS — Z0000 Encounter for general adult medical examination without abnormal findings: Secondary | ICD-10-CM

## 2024-03-14 LAB — LIPID PANEL
Cholesterol: 191 mg/dL (ref 0–200)
HDL: 42.4 mg/dL (ref >39.00)
LDL Cholesterol: 125 mg/dL — ABNORMAL HIGH (ref 0–99)
NonHDL: 148.99
Total CHOL/HDL Ratio: 5
Triglycerides: 118 mg/dL (ref 0.0–149.0)
VLDL: 23.6 mg/dL (ref 0.0–40.0)

## 2024-03-14 LAB — PSA: PSA: 0.81 ng/mL (ref 0.10–4.00)

## 2024-03-14 LAB — TESTOSTERONE: Testosterone: 351.94 ng/dL (ref 300.00–890.00)

## 2024-03-14 MED ORDER — ATORVASTATIN CALCIUM 40 MG PO TABS
40.0000 mg | ORAL_TABLET | Freq: Every day | ORAL | 3 refills | Status: DC
  Filled 2024-03-14: qty 90, 90d supply, fill #0

## 2024-03-14 MED ORDER — ESCITALOPRAM OXALATE 10 MG PO TABS
10.0000 mg | ORAL_TABLET | Freq: Every day | ORAL | 3 refills | Status: DC
  Filled 2024-03-14: qty 90, 90d supply, fill #0
  Filled 2024-06-08: qty 90, 90d supply, fill #1

## 2024-03-14 NOTE — Progress Notes (Unsigned)
 Patient ID: Donald Swanson, male   DOB: 1981-04-08, 43 y.o.   MRN: [id number]         Chief Complaint:: wellness exam and chest pain, left shoulder pain, hld, low tetsosterone, OSA, depression anxiety, low vit d       HPI:  Donald Swanson is a 43 y.o. male here for wellness exam; for tdap and prevnar 20 at pharmacy, o/w up to date                        Also has upper left sternal sore and tenderness after hiking with heavy breathing without radation, sob, diaphoresis, n/v, palps or syncope Does c/o ongoing fatigue, but denies signficant daytime hypersomnolence; has dx of OSA but did not tolerate mask and does not want to address further.  Also separate is left shoulder pain mild intermittent with reduced ROM to reaching overhead.  Has also had mild worsening depressive symptoms with multiple recent stressors, but no suicidal ideation, or panic; has ongoing anxiety.     Wt Readings from Last 3 Encounters:  03/14/24 177 lb (80.3 kg)  03/09/24 177 lb 6.4 oz (80.5 kg)  05/27/23 174 lb (78.9 kg)   BP Readings from Last 3 Encounters:  03/14/24 110/70  03/09/24 133/83  03/07/24 130/83   Immunization History  Administered Date(s) Administered   Influenza,inj,Quad PF,6+ Mos 07/02/2016   Influenza-Unspecified 07/12/2017, 08/12/2018, 07/13/2019, 08/08/2020   PFIZER(Purple Top)SARS-COV-2 Vaccination 05/16/2020, 06/06/2020   Tdap 02/23/2013   Health Maintenance Due  Topic Date Due   Pneumococcal Vaccine 7-41 Years old (1 of 2 - PCV) Never done   DTaP/Tdap/Td (2 - Td or Tdap) 02/24/2023      Past Medical History:  Diagnosis Date   Hyperlipemia    Low testosterone     Past Surgical History:  Procedure Laterality Date   ETHMOIDECTOMY Bilateral 07/09/2020   Procedure: TOTAL ETHMOIDECTOMY;  Surgeon: Prescott Brodie, MD;  Location: [facility];  Service: ENT;  Laterality: Bilateral;   KNEE ARTHROSCOPY WITH MEDIAL MENISECTOMY Right 06/10/2015   Procedure: knee  arthroscopy with medial menisectomy  ;  Surgeon: Wendolyn Hamburger, MD;  Location: [facility];  Service: Orthopedics;  Laterality: Right;   MAXILLARY ANTROSTOMY Bilateral 07/09/2020   Procedure: ENDOSCOPIC MAXILLARY ANTROSTOMY;  Surgeon: Prescott Brodie, MD;  Location: [facility];  Service: ENT;  Laterality: Bilateral;   NASAL SEPTOPLASTY W/ TURBINOPLASTY Bilateral 07/09/2020   Procedure: NASAL SEPTOPLASTY WITH TURBINATE REDUCTION;  Surgeon: Prescott Brodie, MD;  Location: [facility];  Service: ENT;  Laterality: Bilateral;   NASAL SINUS SURGERY Bilateral 07/09/2020   Procedure: ENDOSCOPIC SINUS WITH BALLOON DILATION FRONTAL OSTIUM;  Surgeon: Prescott Brodie, MD;  Location: [facility];  Service: ENT;  Laterality: Bilateral;   SINUS ENDO WITH FUSION Bilateral 07/09/2020   Procedure: SINUS ENDOSCOPY WITH FUSION NAVIGATION;  Surgeon: Prescott Brodie, MD;  Location: [facility];  Service: ENT;  Laterality: Bilateral;    reports that he has quit smoking. His smoking use included cigarettes. He has a 2.3 pack-year smoking history. He has never used smokeless tobacco. He reports current alcohol use. He reports that he does not use drugs. family history includes Atrial fibrillation in his mother; COPD in his mother. No Known Allergies Current Outpatient Medications on File Prior to Visit  Medication Sig Dispense Refill   clonazePAM  (KLONOPIN ) 0.5 MG tablet Take 1 tablet (0.5 mg total) by mouth  2 (two) times daily as needed for anxiety. 30 tablet 1   pantoprazole  (PROTONIX ) 20 MG tablet Take 1 tablet (20 mg total) by mouth daily. 15 tablet 0   No current facility-administered medications on file prior to visit.        ROS:  All others reviewed and negative.  Objective        PE:  BP 110/70 (BP Location: Left Arm, Patient Position: Sitting, Cuff Size: Normal)   Pulse (!) 47   Temp 98.6 F (37 C) (Oral)   Ht 5\' 5"   (1.651 m)   Wt 177 lb (80.3 kg)   SpO2 98%   BMI 29.45 kg/m                 Constitutional: Pt appears in NAD               HENT: Head: NCAT.                Right Ear: External ear normal.                 Left Ear: External ear normal.                Eyes: . Pupils are equal, round, and reactive to light. Conjunctivae and EOM are normal               Nose: without d/c or deformity               Neck: Neck supple. Gross normal ROM               Cardiovascular: Normal rate and regular rhythm.                 Pulmonary/Chest: Effort normal and breath sounds without rales or wheezing.                Abd:  Soft, NT, ND, + BS, no organomegaly               Neurological: Pt is alert. At baseline orientation, motor grossly intact               Skin: Skin is warm. No rashes, no other new lesions, LE edema - none               Psychiatric: Pt behavior is normal without agitation , depressed affect  Micro: none  Cardiac tracings I have personally interpreted today:  none  Pertinent Radiological findings (summarize): none   Lab Results  Component Value Date   WBC 4.9 03/07/2024   HGB 17.0 03/07/2024   HCT 49.0 03/07/2024   PLT 214 03/07/2024   GLUCOSE 95 03/07/2024   CHOL 191 03/14/2024   TRIG 118.0 03/14/2024   HDL 42.40 03/14/2024   LDLDIRECT 205.0 05/27/2023   LDLCALC 125 (H) 03/14/2024   ALT 44 03/07/2024   AST 27 03/07/2024   NA 139 03/07/2024   K 3.8 03/07/2024   CL 101 03/07/2024   CREATININE 1.07 03/07/2024   BUN 9 03/07/2024   CO2 27 03/07/2024   TSH 3.340 03/07/2024   PSA 0.81 03/14/2024   HGBA1C 5.0 05/27/2023   Assessment/Plan:  Donald Swanson is a 43 y.o. White or Caucasian [1] male with  has a past medical history of Hyperlipemia and Low testosterone .  Encounter for well adult exam with abnormal findings Age and sex appropriate education and counseling updated with regular exercise and diet Referrals for preventative services - none needed Immunizations  addressed - for tdap and prevnar 20 at pharmacy Smoking counseling  - none needed Evidence for depression or other mood disorder - worsening depression with anxiety mild without SI - for lexapro 10 mg every day, declines counseling referral Most recent labs reviewed. I have personally reviewed and have noted: 1) the patient's medical and social history 2) The patient's current medications and supplements 3) The patient's height, weight, and BMI have been recorded in the chart   Anxiety and depression Mild to mod, for lexapro 10 mg every day, declines counseling referral,  to f/u any worsening symptoms or concerns   Hyperlipidemia Lab Results  Component Value Date   LDLCALC 125 (H) 03/14/2024   Uncontrolled, pt to increase lipitor 40 mg qd   Low testosterone  Also for recheck with lab today  Vitamin D  deficiency Last vitamin D  Lab Results  Component Value Date   VD25OH 23.35 (L) 05/27/2023   Low, to start oral replacement   Chest pain C/w msk strain, low suspicion for cardiac or other,  to f/u any worsening symptoms or concerns   Left shoulder pain With pain and reduced ROM -  ? Rotater cuff  - pt is encouraged to see Sports medicine Followup: Return in about 1 year (around 03/14/2025).  Rosalia Colonel, MD 03/15/2024 12:32 PM  Medical Group [facility]

## 2024-03-14 NOTE — Patient Instructions (Signed)
 Please take all new medication as prescribed - the lexapo 10 mg per day  Please continue all other medications as before, and refills have been done if requested.  Please have the pharmacy call with any other refills you may need.  Please continue your efforts at being more active, low cholesterol diet, and weight control.  You are otherwise up to date with prevention measures today.  Please keep your appointments with your specialists as you may have planned  Please consider making appt with Sports Medicine on the first floor for the left shoulder pain  Please go to the LAB at the blood drawing area for the tests to be done  You will be contacted by phone if any changes need to be made immediately.  Otherwise, you will receive a letter about your results with an explanation, but please check with MyChart first.  Please make an Appointment to return for your 1 year visit, or sooner if needed

## 2024-03-15 ENCOUNTER — Encounter: Payer: Self-pay | Admitting: Internal Medicine

## 2024-03-15 DIAGNOSIS — M25512 Pain in left shoulder: Secondary | ICD-10-CM | POA: Insufficient documentation

## 2024-03-15 DIAGNOSIS — R079 Chest pain, unspecified: Secondary | ICD-10-CM | POA: Insufficient documentation

## 2024-03-15 NOTE — Assessment & Plan Note (Signed)
 With pain and reduced ROM -  ? Rotater cuff  - pt is encouraged to see Sports medicine

## 2024-03-15 NOTE — Assessment & Plan Note (Signed)
 Also for recheck with lab today

## 2024-03-15 NOTE — Assessment & Plan Note (Signed)
 Age and sex appropriate education and counseling updated with regular exercise and diet Referrals for preventative services - none needed Immunizations addressed - for tdap and prevnar 20 at pharmacy Smoking counseling  - none needed Evidence for depression or other mood disorder - worsening depression with anxiety mild without SI - for lexapro 10 mg every day, declines counseling referral Most recent labs reviewed. I have personally reviewed and have noted: 1) the patient's medical and social history 2) The patient's current medications and supplements 3) The patient's height, weight, and BMI have been recorded in the chart

## 2024-03-15 NOTE — Assessment & Plan Note (Signed)
 Lab Results  Component Value Date   LDLCALC 125 (H) 03/14/2024   Uncontrolled, pt to increase lipitor 40 mg qd

## 2024-03-15 NOTE — Assessment & Plan Note (Signed)
 Mild to mod, for lexapro 10 mg every day, declines counseling referral,  to f/u any worsening symptoms or concerns

## 2024-03-15 NOTE — Assessment & Plan Note (Signed)
Last vitamin D Lab Results  Component Value Date   VD25OH 23.35 (L) 05/27/2023   Low, to start oral replacement

## 2024-03-15 NOTE — Assessment & Plan Note (Signed)
 C/w msk strain, low suspicion for cardiac or other,  to f/u any worsening symptoms or concerns

## 2024-06-08 ENCOUNTER — Other Ambulatory Visit (HOSPITAL_BASED_OUTPATIENT_CLINIC_OR_DEPARTMENT_OTHER): Payer: Self-pay

## 2024-06-08 ENCOUNTER — Other Ambulatory Visit: Payer: Self-pay | Admitting: Internal Medicine

## 2024-06-08 MED ORDER — CLONAZEPAM 0.5 MG PO TABS
0.5000 mg | ORAL_TABLET | Freq: Two times a day (BID) | ORAL | 1 refills | Status: DC | PRN
  Filled 2024-06-08: qty 30, 15d supply, fill #0
  Filled 2024-10-24: qty 30, 15d supply, fill #1

## 2024-07-10 NOTE — Telephone Encounter (Signed)
 Pt phoned in and stated that he feels he is getting a sinus infection. He is blowing greenish yellow and having facial pain. He does  Have an appointment in 08/08/2020. I addressed w/Dr. Ethyl and sent in Rx for Zpac to [facility].

## 2024-08-29 ENCOUNTER — Ambulatory Visit (HOSPITAL_BASED_OUTPATIENT_CLINIC_OR_DEPARTMENT_OTHER): Admitting: Family Medicine

## 2024-09-05 ENCOUNTER — Ambulatory Visit (HOSPITAL_BASED_OUTPATIENT_CLINIC_OR_DEPARTMENT_OTHER): Admitting: Family Medicine

## 2024-09-05 ENCOUNTER — Other Ambulatory Visit (HOSPITAL_BASED_OUTPATIENT_CLINIC_OR_DEPARTMENT_OTHER): Payer: Self-pay

## 2024-09-05 ENCOUNTER — Encounter (HOSPITAL_BASED_OUTPATIENT_CLINIC_OR_DEPARTMENT_OTHER): Payer: Self-pay | Admitting: Family Medicine

## 2024-09-05 VITALS — BP 131/87 | HR 56 | Temp 98.1°F | Resp 16 | Ht 64.96 in | Wt 177.9 lb

## 2024-09-05 DIAGNOSIS — E782 Mixed hyperlipidemia: Secondary | ICD-10-CM

## 2024-09-05 DIAGNOSIS — F32A Depression, unspecified: Secondary | ICD-10-CM | POA: Diagnosis not present

## 2024-09-05 DIAGNOSIS — F419 Anxiety disorder, unspecified: Secondary | ICD-10-CM | POA: Diagnosis not present

## 2024-09-05 DIAGNOSIS — Q2381 Bicuspid aortic valve: Secondary | ICD-10-CM | POA: Diagnosis not present

## 2024-09-05 DIAGNOSIS — L509 Urticaria, unspecified: Secondary | ICD-10-CM | POA: Insufficient documentation

## 2024-09-05 MED ORDER — BUSPIRONE HCL 5 MG PO TABS
5.0000 mg | ORAL_TABLET | Freq: Two times a day (BID) | ORAL | 1 refills | Status: AC
  Filled 2024-09-05: qty 60, 30d supply, fill #0

## 2024-09-05 NOTE — Assessment & Plan Note (Signed)
 I encouraged non sedating Fexofenadine and he declines.

## 2024-09-05 NOTE — Assessment & Plan Note (Addendum)
 Challenging issue.  I'm disappointed he won't reconsider counseling, or a more chronic, daily (non habit forming) anxiolytic.  Risks of benzos reviewed in lay terms.  Trial of Buspar .  I encouraged him to minimize his alcohol and never take it with the Klonopin .  I discouraged his THC gummies.  He is to update me on the portal next week on his experience with the Buspar .

## 2024-09-05 NOTE — Assessment & Plan Note (Signed)
 F/u with Dr. Lavona as directed.  Cardiology already aware of the patient's atypical chest pain.

## 2024-09-05 NOTE — Progress Notes (Signed)
 Established Patient Office Visit  Subjective   Patient ID: Donald Swanson, male    DOB: [birth year]  Age: 43 y.o. MRN: [id number]  Chief Complaint  Patient presents with   Establish Care    Establish Care     F/u as above.  Known to our urgent care but new to my practice.  He is frustrated with side effects related to his statin.  Primarily muscle aches and joint pain.  Previously intolerant to Lexapro  and states he really doesn't like to take daily medications.  Unfortunately he declines to consider therapy for his anxiety.  He minimizes any mood issues and states he doesn't feel that's the problem.  Extended discussion.  As he was leaving, he also notes a subtle red rash along his forearms that pops up on occasion.  Minimal itching.    Past Medical History:  Diagnosis Date   Anxiety    Aortic root dilatation    Bicuspid aortic valve    f/by Dr. Lavona   Former smoker    Distant history   History of hypogonadism    Hyperlipemia    OSA (obstructive sleep apnea)    with rx declined.   Overweight     Outpatient Encounter Medications as of 09/05/2024  Medication Sig   atorvastatin  (LIPITOR) 40 MG tablet Take 1 tablet (40 mg total) by mouth daily.   busPIRone  (BUSPAR ) 5 MG tablet Take 1 tablet (5 mg total) by mouth 2 (two) times daily.   clonazePAM  (KLONOPIN ) 0.5 MG tablet Take 1 tablet (0.5 mg total) by mouth 2 (two) times daily as needed for anxiety.   pantoprazole  (PROTONIX ) 20 MG tablet Take 1 tablet (20 mg total) by mouth daily. (Patient not taking: Reported on 09/05/2024)   [DISCONTINUED] escitalopram  (LEXAPRO ) 10 MG tablet Take 1 tablet (10 mg total) by mouth daily.   No facility-administered encounter medications on file as of 09/05/2024.    Social History   Tobacco Use   Smoking status: Former    Current packs/day: 0.25    Average packs/day: 0.3 packs/day for 9.0 years (2.3 ttl pk-yrs)    Types: Cigarettes   Smokeless tobacco: Never  Vaping Use    Vaping status: Never Used  Substance Use Topics   Alcohol use: Yes    Comment: Light   Drug use: Yes    Types: Marijuana    Comment: Occasional gummy      Review of Systems  Constitutional:  Negative for malaise/fatigue and weight loss.  Cardiovascular:  Negative for chest pain and palpitations.  Neurological:  Negative for dizziness.  Psychiatric/Behavioral:  Positive for depression. Negative for hallucinations, memory loss, substance abuse and suicidal ideas. The patient is nervous/anxious. The patient does not have insomnia.       Objective:     BP 131/87 (Cuff Size: Normal)   Pulse (!) 56   Temp 98.1 F (36.7 C) (Oral)   Resp 16   Ht 5' 4.96 (1.65 m)   Wt 177 lb 14.4 oz (80.7 kg)   SpO2 97%   BMI 29.64 kg/m    Physical Exam Constitutional:      General: He is not in acute distress.    Appearance: Normal appearance.  HENT:     Head: Normocephalic.  Neck:     Vascular: No carotid bruit.  Cardiovascular:     Rate and Rhythm: Normal rate and regular rhythm.     Pulses: Normal pulses.     Heart sounds: Murmur heard.  Comments: 2/6 SEM Pulmonary:     Effort: Pulmonary effort is normal.     Breath sounds: Normal breath sounds.  Abdominal:     General: Bowel sounds are normal.     Palpations: Abdomen is soft.  Musculoskeletal:     Cervical back: Neck supple. No tenderness.     Right lower leg: No edema.     Left lower leg: No edema.  Skin:    Comments: Subtle erythematous macular exanthem along his forearms.  Likely mild Urticaria.  No scaling or tenderness.  Neurological:     Mental Status: He is alert.      No results found for any visits on 09/05/24.    The 10-year ASCVD risk score (Arnett DK, et al., 2019) is: 1.5%    Assessment & Plan:  Bicuspid aortic valve Assessment & Plan: F/u with Dr. Lavona as directed.  Cardiology already aware of the patient's atypical chest pain.   Anxiety and depression Assessment & Plan: Challenging  issue.  I'm disappointed he won't reconsider counseling, or a more chronic, daily (non habit forming) anxiolytic.  Risks of benzos reviewed in lay terms.  Trial of Buspar .  I encouraged him to minimize his alcohol and never take it with the Klonopin .  I discouraged his THC gummies.  He is to update me on the portal next week on his experience with the Buspar .  Orders: -     busPIRone  HCl; Take 1 tablet (5 mg total) by mouth 2 (two) times daily.  Dispense: 30 tablet; Refill: 1  Mixed hyperlipidemia Assessment & Plan: Patient prefers to stop the Atorvastatin .  I suggested a trial of Coenzyme Q-10 and he declines for now.  Consider an alternative statin next week.   Urticaria Assessment & Plan: I encouraged non sedating Fexofenadine and he declines.     Return in about 4 weeks (around 10/03/2024) for chronic follow-up.    REDDING PONCE NORLEEN FALCON., MD

## 2024-09-05 NOTE — Assessment & Plan Note (Signed)
 Patient prefers to stop the Atorvastatin .  I suggested a trial of Coenzyme Q-10 and he declines for now.  Consider an alternative statin next week.

## 2024-09-12 ENCOUNTER — Encounter: Payer: Self-pay | Admitting: Physician Assistant

## 2024-10-03 ENCOUNTER — Encounter (HOSPITAL_BASED_OUTPATIENT_CLINIC_OR_DEPARTMENT_OTHER): Payer: Self-pay | Admitting: Family Medicine

## 2024-10-03 ENCOUNTER — Ambulatory Visit (HOSPITAL_BASED_OUTPATIENT_CLINIC_OR_DEPARTMENT_OTHER): Admitting: Family Medicine

## 2024-10-03 VITALS — BP 125/80 | HR 59 | Temp 98.2°F | Resp 16 | Wt 178.1 lb

## 2024-10-03 DIAGNOSIS — R5383 Other fatigue: Secondary | ICD-10-CM | POA: Diagnosis not present

## 2024-10-03 DIAGNOSIS — F5101 Primary insomnia: Secondary | ICD-10-CM

## 2024-10-03 DIAGNOSIS — E785 Hyperlipidemia, unspecified: Secondary | ICD-10-CM | POA: Diagnosis not present

## 2024-10-03 DIAGNOSIS — F419 Anxiety disorder, unspecified: Secondary | ICD-10-CM | POA: Diagnosis not present

## 2024-10-03 DIAGNOSIS — J069 Acute upper respiratory infection, unspecified: Secondary | ICD-10-CM | POA: Diagnosis not present

## 2024-10-03 DIAGNOSIS — G4700 Insomnia, unspecified: Secondary | ICD-10-CM | POA: Insufficient documentation

## 2024-10-03 LAB — COMPREHENSIVE METABOLIC PANEL WITH GFR
ALT: 39 IU/L (ref 0–44)
AST: 27 IU/L (ref 0–40)
Albumin: 4.4 g/dL (ref 4.1–5.1)
Alkaline Phosphatase: 76 IU/L (ref 47–123)
BUN/Creatinine Ratio: 9 (ref 9–20)
BUN: 10 mg/dL (ref 6–24)
Bilirubin Total: 0.6 mg/dL (ref 0.0–1.2)
CO2: 25 mmol/L (ref 20–29)
Calcium: 9.8 mg/dL (ref 8.7–10.2)
Chloride: 99 mmol/L (ref 96–106)
Creatinine, Ser: 1.12 mg/dL (ref 0.76–1.27)
Globulin, Total: 3.1 g/dL (ref 1.5–4.5)
Glucose: 77 mg/dL (ref 70–99)
Potassium: 4.4 mmol/L (ref 3.5–5.2)
Sodium: 139 mmol/L (ref 134–144)
Total Protein: 7.5 g/dL (ref 6.0–8.5)
eGFR: 85 mL/min/1.73

## 2024-10-03 LAB — CBC WITH DIFFERENTIAL/PLATELET
Basophils Absolute: 0 x10E3/uL (ref 0.0–0.2)
Basos: 1 %
EOS (ABSOLUTE): 0.1 x10E3/uL (ref 0.0–0.4)
Eos: 2 %
Hematocrit: 49.7 % (ref 37.5–51.0)
Hemoglobin: 16.6 g/dL (ref 13.0–17.7)
Immature Grans (Abs): 0 x10E3/uL (ref 0.0–0.1)
Immature Granulocytes: 0 %
Lymphocytes Absolute: 1.8 x10E3/uL (ref 0.7–3.1)
Lymphs: 32 %
MCH: 31.8 pg (ref 26.6–33.0)
MCHC: 33.4 g/dL (ref 31.5–35.7)
MCV: 95 fL (ref 79–97)
Monocytes Absolute: 0.4 x10E3/uL (ref 0.1–0.9)
Monocytes: 7 %
Neutrophils Absolute: 3.2 x10E3/uL (ref 1.4–7.0)
Neutrophils: 58 %
Platelets: 254 x10E3/uL (ref 150–450)
RBC: 5.22 x10E6/uL (ref 4.14–5.80)
RDW: 12.9 % (ref 11.6–15.4)
WBC: 5.5 x10E3/uL (ref 3.4–10.8)

## 2024-10-03 LAB — LIPID PANEL
Chol/HDL Ratio: 8.1 ratio — ABNORMAL HIGH (ref 0.0–5.0)
Cholesterol, Total: 242 mg/dL — ABNORMAL HIGH (ref 100–199)
HDL: 30 mg/dL — ABNORMAL LOW
LDL Chol Calc (NIH): 169 mg/dL — ABNORMAL HIGH (ref 0–99)
Triglycerides: 226 mg/dL — ABNORMAL HIGH (ref 0–149)
VLDL Cholesterol Cal: 43 mg/dL — ABNORMAL HIGH (ref 5–40)

## 2024-10-03 NOTE — Assessment & Plan Note (Signed)
 Recent upper respiratory infection with symptoms similar to a cold, but no fever. He has been using over-the-counter medications for symptom relief. - Continue over-the-counter medications such as Mucinex and Robitussin for symptom relief. - Consider Tessalon  Pearls if stronger cough medicine is needed.

## 2024-10-03 NOTE — Progress Notes (Signed)
 "  Established Patient Office Visit  Subjective   Patient ID: Donald Swanson, male    DOB: 04/10/1981  Age: 43 y.o. MRN: [id number]  Chief Complaint  Patient presents with   Follow-up    Follow-up    Discussed the use of AI scribe software for clinical note transcription with the patient, who gave verbal consent to proceed.  History of Present Illness Donald Swanson is a 43 year old male who presents for medication management and follow-up.  He is currently taking Buspar  intermittently as needed, rather than on a daily basis. He experiences a 'head buzz'  when taking it, but finds it manageable. He feels he does not need it every day and is satisfied with its current use.  He uses Klonopin  to aid sleep, taking approximately half a pill, which he finds effective. He does not use it nightly, typically taking it once or twice a week.  He attempted to restart his cholesterol medication around Thanksgiving but experienced adverse effects, including feeling unwell and increased bowel movements. He discontinued the medication after these symptoms. He has not taken the higher dose prescribed and is not currently on any cholesterol medication.  He mentions having a recent minor cold.  His son tested positive for the flu recently. He has been using over-the-counter medications to manage symptoms, including a product containing dextrose . No fever with his recent illness.    Past Medical History:  Diagnosis Date   Anxiety    Aortic root dilatation    Bicuspid aortic valve    f/by Dr. Lavona   Former smoker    Distant history   History of hypogonadism    Hyperlipemia    OSA (obstructive sleep apnea)    with rx declined.   Overweight     Outpatient Encounter Medications as of 10/03/2024  Medication Sig   atorvastatin  (LIPITOR) 40 MG tablet Take 1 tablet (40 mg total) by mouth daily.   busPIRone  (BUSPAR ) 5 MG tablet Take 1 tablet (5 mg total) by mouth 2 (two) times  daily.   clonazePAM  (KLONOPIN ) 0.5 MG tablet Take 1 tablet (0.5 mg total) by mouth 2 (two) times daily as needed for anxiety.   pantoprazole  (PROTONIX ) 20 MG tablet Take 1 tablet (20 mg total) by mouth daily. (Patient not taking: Reported on 09/05/2024)   No facility-administered encounter medications on file as of 10/03/2024.    Social History[1]    Review of Systems  Constitutional:  Negative for malaise/fatigue and weight loss.  Cardiovascular:  Negative for chest pain and palpitations.  Neurological:  Negative for dizziness.  Psychiatric/Behavioral:  Negative for depression, hallucinations, memory loss, substance abuse and suicidal ideas. The patient is nervous/anxious and has insomnia.       Objective:     BP 125/80 (Cuff Size: Normal)   Pulse (!) 59   Temp 98.2 F (36.8 C) (Oral)   Resp 16   Wt 178 lb 1.6 oz (80.8 kg)   SpO2 98%   BMI 29.67 kg/m    Physical Exam Constitutional:      General: He is not in acute distress.    Appearance: Normal appearance.  HENT:     Head: Normocephalic.     Nose: Rhinorrhea present. No congestion.     Mouth/Throat:     Pharynx: Oropharynx is clear.  Neck:     Vascular: No carotid bruit.  Cardiovascular:     Rate and Rhythm: Normal rate and regular rhythm.     Pulses:  Normal pulses.     Heart sounds: Normal heart sounds.  Pulmonary:     Effort: Pulmonary effort is normal.     Breath sounds: Normal breath sounds.  Abdominal:     General: Bowel sounds are normal.     Palpations: Abdomen is soft.  Musculoskeletal:     Cervical back: Neck supple. No tenderness.     Right lower leg: No edema.     Left lower leg: No edema.  Neurological:     Mental Status: He is alert.      No results found for any visits on 10/03/24.    The 10-year ASCVD risk score (Arnett DK, et al., 2019) is: 1.4%    Assessment & Plan:   Assessment & Plan Fatigue, unspecified type Mild, but reevaluate some labs. Orders:   Comprehensive  metabolic panel with GFR   CBC with Differential/Platelet  Dyslipidemia Previous intolerance to Atorvastatin , causing gastrointestinal upset. He has not resumed statin therapy due to adverse effects. Alternative statins may be better tolerated.  Low fat diet. - Will consider alternative statin therapy if cholesterol levels remain elevated.  Orders:   Lipid panel  Primary insomnia Intermittent use of Klonopin  for sleep, taken approximately twice a week. He reports improved sleep with this regimen and does not require nightly use. - Continue Klonopin  as needed for sleep, approximately twice a week.     Anxiety Intermittent use of Buspar  as needed for anxiety, with mild side effects such as a buzzing sensation in the head. He prefers not to take it daily and finds it helpful when needed. - Continue Buspar  as needed for anxiety.    Viral upper respiratory tract infection Recent upper respiratory infection with symptoms similar to a cold, but no fever. He has been using over-the-counter medications for symptom relief. - Continue over-the-counter medications such as Mucinex and Robitussin for symptom relief. - Consider Tessalon  Pearls if stronger cough medicine is needed.       Return in about 6 months (around 04/03/2025) for chronic follow-up.    REDDING PONCE NORLEEN FALCON., MD    [1]  Social History Tobacco Use   Smoking status: Former    Current packs/day: 0.25    Average packs/day: 0.3 packs/day for 9.0 years (2.3 ttl pk-yrs)    Types: Cigarettes   Smokeless tobacco: Never  Vaping Use   Vaping status: Never Used  Substance Use Topics   Alcohol use: Yes    Comment: Light   Drug use: Yes    Types: Marijuana    Comment: Occasional gummy   "

## 2024-10-03 NOTE — Assessment & Plan Note (Signed)
 Intermittent use of Buspar  as needed for anxiety, with mild side effects such as a buzzing sensation in the head. He prefers not to take it daily and finds it helpful when needed. - Continue Buspar  as needed for anxiety.

## 2024-10-03 NOTE — Assessment & Plan Note (Signed)
 Intermittent use of Klonopin  for sleep, taken approximately twice a week. He reports improved sleep with this regimen and does not require nightly use. - Continue Klonopin  as needed for sleep, approximately twice a week.

## 2024-10-03 NOTE — Assessment & Plan Note (Signed)
 Mild, but reevaluate some labs. Orders:   Comprehensive metabolic panel with GFR   CBC with Differential/Platelet

## 2024-10-06 ENCOUNTER — Other Ambulatory Visit (HOSPITAL_BASED_OUTPATIENT_CLINIC_OR_DEPARTMENT_OTHER): Payer: Self-pay | Admitting: Family Medicine

## 2024-10-06 ENCOUNTER — Ambulatory Visit (HOSPITAL_BASED_OUTPATIENT_CLINIC_OR_DEPARTMENT_OTHER): Payer: Self-pay | Admitting: Family Medicine

## 2024-10-06 ENCOUNTER — Other Ambulatory Visit (HOSPITAL_BASED_OUTPATIENT_CLINIC_OR_DEPARTMENT_OTHER): Payer: Self-pay

## 2024-10-06 DIAGNOSIS — E782 Mixed hyperlipidemia: Secondary | ICD-10-CM

## 2024-10-06 MED ORDER — PRAVASTATIN SODIUM 20 MG PO TABS
20.0000 mg | ORAL_TABLET | Freq: Every day | ORAL | 2 refills | Status: AC
  Filled 2024-10-06: qty 30, 30d supply, fill #0

## 2024-10-20 NOTE — Progress Notes (Unsigned)
 "  Chief Complaint: GERD  HPI:    [name] is a 44 year old Caucasian male with a past medical history as listed below including bicuspid aortic valve, OSA and multiple others, who was referred to me by Dottie Norleen PHEBE PONCE, MD for a complaint of GERD.    10/03/2024 CBC, CMP normal.  Past Medical History:  Diagnosis Date   Anxiety    Aortic root dilatation    Bicuspid aortic valve    f/by Dr. Lavona   Former smoker    Distant history   History of hypogonadism    Hyperlipemia    OSA (obstructive sleep apnea)    with rx declined.   Overweight     Past Surgical History:  Procedure Laterality Date   ETHMOIDECTOMY Bilateral 07/09/2020   Procedure: TOTAL ETHMOIDECTOMY;  Surgeon: Ethyl Lonni BRAVO, MD;  Location: [facility];  Service: ENT;  Laterality: Bilateral;   KNEE ARTHROSCOPY WITH MEDIAL MENISECTOMY Right 06/10/2015   Procedure: knee arthroscopy with medial menisectomy  ;  Surgeon: Dempsey Sensor, MD;  Location: [facility];  Service: Orthopedics;  Laterality: Right;   MAXILLARY ANTROSTOMY Bilateral 07/09/2020   Procedure: ENDOSCOPIC MAXILLARY ANTROSTOMY;  Surgeon: Ethyl Lonni BRAVO, MD;  Location: [facility];  Service: ENT;  Laterality: Bilateral;   NASAL SEPTOPLASTY W/ TURBINOPLASTY Bilateral 07/09/2020   Procedure: NASAL SEPTOPLASTY WITH TURBINATE REDUCTION;  Surgeon: Ethyl Lonni BRAVO, MD;  Location: [facility];  Service: ENT;  Laterality: Bilateral;   NASAL SINUS SURGERY Bilateral 07/09/2020   Procedure: ENDOSCOPIC SINUS WITH BALLOON DILATION FRONTAL OSTIUM;  Surgeon: Ethyl Lonni BRAVO, MD;  Location: [facility];  Service: ENT;  Laterality: Bilateral;   SINUS ENDO WITH FUSION Bilateral 07/09/2020   Procedure: SINUS ENDOSCOPY WITH FUSION NAVIGATION;  Surgeon: Ethyl Lonni BRAVO, MD;  Location: [facility];  Service: ENT;  Laterality: Bilateral;    Current Outpatient  Medications  Medication Sig Dispense Refill   busPIRone  (BUSPAR ) 5 MG tablet Take 1 tablet (5 mg total) by mouth 2 (two) times daily. 30 tablet 1   clonazePAM  (KLONOPIN ) 0.5 MG tablet Take 1 tablet (0.5 mg total) by mouth 2 (two) times daily as needed for anxiety. 30 tablet 1   pantoprazole  (PROTONIX ) 20 MG tablet Take 1 tablet (20 mg total) by mouth daily. (Patient not taking: Reported on 09/05/2024) 15 tablet 0   pravastatin  (PRAVACHOL ) 20 MG tablet Take 1 tablet (20 mg total) by mouth daily. 30 tablet 2   No current facility-administered medications for this visit.    Allergies as of 10/23/2024 - Review Complete 10/03/2024  Allergen Reaction Noted   Atorvastatin   10/03/2024   Lexapro  [escitalopram ]  09/05/2024    Family History  Problem Relation Age of Onset   COPD Mother    Atrial fibrillation Mother    Osteoarthritis Father        Largely unknown    Social History   Socioeconomic History   Marital status: Single    Spouse name: Not on file   Number of children: 1   Years of education: 14   Highest education level: 12th grade  Occupational History   Occupation: Curator  Tobacco Use   Smoking status: Former    Current packs/day: 0.25    Average packs/day: 0.3 packs/day for 9.0 years (2.3 ttl pk-yrs)    Types: Cigarettes   Smokeless tobacco: Never  Vaping Use   Vaping status: Never Used  Substance and Sexual Activity  Alcohol use: Yes    Comment: Light   Drug use: Yes    Types: Marijuana    Comment: Occasional gummy   Sexual activity: Not on file  Other Topics Concern   Not on file  Social History Narrative   Fun: Work on cars, guns, build   Social Drivers of Health   Tobacco Use: Medium Risk (10/03/2024)   Patient History    Smoking Tobacco Use: Former    Smokeless Tobacco Use: Never    Passive Exposure: Not on Actuary Strain: Low Risk (09/04/2024)   Overall Financial Resource Strain (CARDIA)    Difficulty of Paying Living Expenses:  Not very hard  Food Insecurity: No Food Insecurity (09/04/2024)   Epic    Worried About Programme Researcher, Broadcasting/film/video in the Last Year: Never true    Ran Out of Food in the Last Year: Never true  Transportation Needs: No Transportation Needs (09/04/2024)   Epic    Lack of Transportation (Medical): No    Lack of Transportation (Non-Medical): No  Physical Activity: Insufficiently Active (09/04/2024)   Exercise Vital Sign    Days of Exercise per Week: 2 days    Minutes of Exercise per Session: 20 min  Stress: No Stress Concern Present (09/04/2024)   [employer] of Occupational Health - Occupational Stress Questionnaire    Feeling of Stress: Only a little  Social Connections: Socially Isolated (09/04/2024)   Social Connection and Isolation Panel    Frequency of Communication with Friends and Family: Once a week    Frequency of Social Gatherings with Friends and Family: Once a week    Attends Religious Services: 1 to 4 times per year    Active Member of [employer] or Organizations: No    Attends Engineer, Structural: Not on file    Marital Status: Divorced  Intimate Partner Violence: Not on file  Depression (PHQ2-9): Medium Risk (09/05/2024)   Depression (PHQ2-9)    PHQ-2 Score: 5  Alcohol Screen: Low Risk (09/04/2024)   Alcohol Screen    Last Alcohol Screening Score (AUDIT): 4  Housing: Unknown (09/04/2024)   Epic    Unable to Pay for Housing in the Last Year: No    Number of Times Moved in the Last Year: Not on file    Homeless in the Last Year: No  Utilities: Not on file  Health Literacy: Not on file    Review of Systems:    Constitutional: No weight loss, fever, chills, weakness or fatigue HEENT: Eyes: No change in vision               Ears, Nose, Throat:  No change in hearing or congestion Skin: No rash or itching Cardiovascular: No chest pain, chest pressure or palpitations   Respiratory: No SOB or cough Gastrointestinal: See HPI and otherwise negative Genitourinary:  No dysuria or change in urinary frequency Neurological: No headache, dizziness or syncope Musculoskeletal: No new muscle or joint pain Hematologic: No bleeding or bruising Psychiatric: No history of depression or anxiety    Physical Exam:  Vital signs: There were no vitals taken for this visit.  Constitutional:   Pleasant Caucasian male appears to be in NAD, Well developed, Well nourished, alert and cooperative Head:  Normocephalic and atraumatic. Eyes:   PEERL, EOMI. No icterus. Conjunctiva pink. Ears:  Normal auditory acuity. Neck:  Supple Throat: Oral cavity and pharynx without inflammation, swelling or lesion.  Respiratory: Respirations even and unlabored. Lungs clear to auscultation bilaterally.  No wheezes, crackles, or rhonchi.  Cardiovascular: Normal S1, S2. No MRG. Regular rate and rhythm. No peripheral edema, cyanosis or pallor.  Gastrointestinal:  Soft, nondistended, nontender. No rebound or guarding. Normal bowel sounds. No appreciable masses or hepatomegaly. Rectal:  Not performed.  Msk:  Symmetrical without gross deformities. Without edema, no deformity or joint abnormality.  Neurologic:  Alert and  oriented x4;  grossly normal neurologically.  Skin:   Dry and intact without significant lesions or rashes. Psychiatric: Oriented to person, place and time. Demonstrates good judgement and reason without abnormal affect or behaviors.  RELEVANT LABS AND IMAGING: CBC    Component Value Date/Time   WBC 5.5 10/03/2024 0847   WBC 4.9 03/07/2024 1154   RBC 5.22 10/03/2024 0847   RBC 5.27 03/07/2024 1154   HGB 16.6 10/03/2024 0847   HCT 49.7 10/03/2024 0847   PLT 254 10/03/2024 0847   MCV 95 10/03/2024 0847   MCH 31.8 10/03/2024 0847   MCH 32.3 03/07/2024 1154   MCHC 33.4 10/03/2024 0847   MCHC 34.7 03/07/2024 1154   RDW 12.9 10/03/2024 0847   LYMPHSABS 1.8 10/03/2024 0847   MONOABS 0.3 03/07/2024 1154   EOSABS 0.1 10/03/2024 0847   BASOSABS 0.0 10/03/2024 0847     CMP     Component Value Date/Time   NA 139 10/03/2024 0847   K 4.4 10/03/2024 0847   CL 99 10/03/2024 0847   CO2 25 10/03/2024 0847   GLUCOSE 77 10/03/2024 0847   GLUCOSE 95 03/07/2024 1154   BUN 10 10/03/2024 0847   CREATININE 1.12 10/03/2024 0847   CALCIUM  9.8 10/03/2024 0847   PROT 7.5 10/03/2024 0847   ALBUMIN 4.4 10/03/2024 0847   AST 27 10/03/2024 0847   ALT 39 10/03/2024 0847   ALKPHOS 76 10/03/2024 0847   BILITOT 0.6 10/03/2024 0847   GFRNONAA >60 03/07/2024 1154    Assessment: 1. ***  Plan: 1. ***     Delon Failing, PA-C [facility] 10/20/2024, 1:21 PM  Cc: Dottie Norleen PHEBE PONCE, MD  "

## 2024-10-23 ENCOUNTER — Encounter: Payer: Self-pay | Admitting: Physician Assistant

## 2024-10-23 ENCOUNTER — Other Ambulatory Visit (HOSPITAL_BASED_OUTPATIENT_CLINIC_OR_DEPARTMENT_OTHER): Payer: Self-pay

## 2024-10-23 ENCOUNTER — Ambulatory Visit: Admitting: Physician Assistant

## 2024-10-23 ENCOUNTER — Telehealth: Payer: Self-pay | Admitting: *Deleted

## 2024-10-23 VITALS — BP 126/78 | HR 60 | Ht 64.0 in | Wt 181.0 lb

## 2024-10-23 DIAGNOSIS — K5900 Constipation, unspecified: Secondary | ICD-10-CM

## 2024-10-23 DIAGNOSIS — R0789 Other chest pain: Secondary | ICD-10-CM

## 2024-10-23 DIAGNOSIS — K219 Gastro-esophageal reflux disease without esophagitis: Secondary | ICD-10-CM

## 2024-10-23 MED ORDER — OMEPRAZOLE 20 MG PO CPDR
20.0000 mg | DELAYED_RELEASE_CAPSULE | Freq: Every day | ORAL | 5 refills | Status: AC
  Filled 2024-10-23: qty 30, 30d supply, fill #0

## 2024-10-23 MED ORDER — LINACLOTIDE 72 MCG PO CAPS
72.0000 ug | ORAL_CAPSULE | Freq: Every day | ORAL | 2 refills | Status: DC
  Filled 2024-10-23: qty 30, 30d supply, fill #0

## 2024-10-23 NOTE — Progress Notes (Signed)
 ____________________________________________________________  Attending physician addendum:  Thank you for sending this case to me. I have reviewed the entire note and agree with the plan.  Patient reasonable.  However, the episode of chest pain beginning suddenly last May and lasting for months seems unlikely to have been GERD.  Victory Brand, MD  ____________________________________________________________

## 2024-10-23 NOTE — Patient Instructions (Addendum)
 We have sent the following medications to your pharmacy for you to pick up at your convenience: Omeprazole  20 mg daily 30-60 minutes before breakfast.   You have been scheduled for an endoscopy. Please follow written instructions given to you at your visit today.  If you use inhalers (even only as needed), please bring them with you on the day of your procedure.  If you take any of the following medications, they will need to be adjusted prior to your procedure:   DO NOT TAKE 7 DAYS PRIOR TO TEST- Trulicity (dulaglutide) Ozempic, Wegovy (semaglutide) Mounjaro, Zepbound (tirzepatide) Bydureon Bcise (exanatide extended release)  DO NOT TAKE 1 DAY PRIOR TO YOUR TEST Rybelsus (semaglutide) Adlyxin (lixisenatide) Victoza (liraglutide) Byetta (exanatide) ___________________________________________________________________________

## 2024-10-23 NOTE — Telephone Encounter (Signed)
 Patient is scheduled for pre-op clearance on 10/25/24 with Donald Bane, NP. Med list and consent done.      Patient Consent for Virtual Visit        Donald Swanson has provided verbal consent on 10/23/2024 for a virtual visit (video or telephone).   CONSENT FOR VIRTUAL VISIT FOR:  Donald Swanson  By participating in this virtual visit I agree to the following:  I hereby voluntarily request, consent and authorize [facility] and its employed or contracted physicians, physician assistants, nurse practitioners or other licensed health care professionals (the Practitioner), to provide me with telemedicine health care services (the Services) as deemed necessary by the treating Practitioner. I acknowledge and consent to receive the Services by the Practitioner via telemedicine. I understand that the telemedicine visit will involve communicating with the Practitioner through live audiovisual communication technology and the disclosure of certain medical information by electronic transmission. I acknowledge that I have been given the opportunity to request an in-person assessment or other available alternative prior to the telemedicine visit and am voluntarily participating in the telemedicine visit.  I understand that I have the right to withhold or withdraw my consent to the use of telemedicine in the course of my care at any time, without affecting my right to future care or treatment, and that the Practitioner or I may terminate the telemedicine visit at any time. I understand that I have the right to inspect all information obtained and/or recorded in the course of the telemedicine visit and may receive copies of available information for a reasonable fee.  I understand that some of the potential risks of receiving the Services via telemedicine include:  Delay or interruption in medical evaluation due to technological equipment failure or disruption; Information transmitted  may not be sufficient (e.g. poor resolution of images) to allow for appropriate medical decision making by the Practitioner; and/or  In rare instances, security protocols could fail, causing a breach of personal health information.  Furthermore, I acknowledge that it is my responsibility to provide information about my medical history, conditions and care that is complete and accurate to the best of my ability. I acknowledge that Practitioner's advice, recommendations, and/or decision may be based on factors not within their control, such as incomplete or inaccurate data provided by me or distortions of diagnostic images or specimens that may result from electronic transmissions. I understand that the practice of medicine is not an exact science and that Practitioner makes no warranties or guarantees regarding treatment outcomes. I acknowledge that a copy of this consent can be made available to me via my patient portal [facility] MyChart), or I can request a printed copy by calling the office of [facility].    I understand that my insurance will be billed for this visit.   I have read or had this consent read to me. I understand the contents of this consent, which adequately explains the benefits and risks of the Services being provided via telemedicine.  I have been provided ample opportunity to ask questions regarding this consent and the Services and have had my questions answered to my satisfaction. I give my informed consent for the services to be provided through the use of telemedicine in my medical care

## 2024-10-23 NOTE — Telephone Encounter (Signed)
 [facility] Pre-operative Risk Assessment     Request for surgical clearance:     Endoscopy Procedure  What type of surgery is being performed?     EGD  When is this surgery scheduled?     11/07/24  What type of clearance is required ?   Cardiac  Practice name and name of physician performing surgery?      [facility]  What is your office phone and fax number?      Phone- [phone]  Fax- [phone]  Anesthesia type (None, local, MAC, general) ?       MAC   Please route your response to Powell Misty, CMA

## 2024-10-23 NOTE — Telephone Encounter (Signed)
" ° °  Name: Donald Swanson  DOB: May 14, 1981  MRN: [id number]  Primary Cardiologist: Lynwood Schilling, MD   Preoperative team, please contact this patient and set up a phone call appointment for further preoperative risk assessment. Please obtain consent and complete medication review. Thank you for your help.  I confirm that guidance regarding antiplatelet and oral anticoagulation therapy has been completed and, if necessary, noted below.  Patient is not on anticoagulation or antiplatelet per review of current medical record in Epic.    I also confirmed the patient resides in the state of [place] . As per [facility] Medical Board telemedicine laws, the patient must reside in the state in which the provider is licensed.   Lamarr Satterfield, NP 10/23/2024, 9:54 AM [facility]    "

## 2024-10-24 ENCOUNTER — Other Ambulatory Visit (HOSPITAL_BASED_OUTPATIENT_CLINIC_OR_DEPARTMENT_OTHER): Payer: Self-pay

## 2024-10-24 NOTE — Progress Notes (Unsigned)
 "   Virtual Visit via Telephone Note   Because of Donald Swanson co-morbid illnesses, he is at least at moderate risk for complications without adequate follow up.  This format is felt to be most appropriate for this patient at this time.  Due to technical limitations with video connection web designer), today's appointment will be conducted as an audio only telehealth visit, and [name] verbally agreed to proceed in this manner.   All issues noted in this document were discussed and addressed.  No physical exam could be performed with this format.  Evaluation Performed:  Preoperative cardiovascular risk assessment _____________   Date:  10/25/2024   Patient ID:  Donald Swanson, DOB [birth year], MRN [id number] Patient Location:  Home Provider location:   Office  Primary Care Provider:  Dottie Norleen PHEBE PONCE, MD Primary Cardiologist:  Lynwood Schilling, MD  Chief Complaint / Patient Profile   44 y.o. y/o male with a h/o bicuspid aortic valve, stable ascending aorta dilation of 4.4 cm on chest CTA 03/09/2024, untreated OSA, GERD who is pending EGD 11/07/24 and presents today for telephonic preoperative cardiovascular risk assessment.  History of Present Illness    Donald Swanson is a 44 y.o. male who presents via audio/video conferencing for a telehealth visit today.  Pt was last seen in cardiology clinic on 03/09/2024 by Dr. Schilling.  At that time [name] had complaints of atypical chest discomfort and new dyspnea. Chest CTA was ordered and was negative for PE. Dilation of ascending aorta stable. The patient is now pending procedure as outlined above. Since his last visit, he has done well from a cardiac standpoint. He works in production designer, theatre/television/film for [employer] and is able to complete greater than 4 METS of activity without concerning cardiac symptoms. He denies chest pain, shortness of breath, orthopnea, PND, lower extremity edema, frequent palpitations, lightheadedness,  dizziness, syncope.  Past Medical History    Past Medical History:  Diagnosis Date   Anxiety    Aortic root dilatation    Bicuspid aortic valve    f/by Dr. Schilling   Former smoker    Distant history   History of hypogonadism    Hyperlipemia    OSA (obstructive sleep apnea)    with rx declined.   Overweight    Past Surgical History:  Procedure Laterality Date   ETHMOIDECTOMY Bilateral 07/09/2020   Procedure: TOTAL ETHMOIDECTOMY;  Surgeon: Ethyl Lonni BRAVO, MD;  Location: [facility];  Service: ENT;  Laterality: Bilateral;   KNEE ARTHROSCOPY WITH MEDIAL MENISECTOMY Right 06/10/2015   Procedure: knee arthroscopy with medial menisectomy  ;  Surgeon: Dempsey Sensor, MD;  Location: [facility];  Service: Orthopedics;  Laterality: Right;   MAXILLARY ANTROSTOMY Bilateral 07/09/2020   Procedure: ENDOSCOPIC MAXILLARY ANTROSTOMY;  Surgeon: Ethyl Lonni BRAVO, MD;  Location: [facility];  Service: ENT;  Laterality: Bilateral;   NASAL SEPTOPLASTY W/ TURBINOPLASTY Bilateral 07/09/2020   Procedure: NASAL SEPTOPLASTY WITH TURBINATE REDUCTION;  Surgeon: Ethyl Lonni BRAVO, MD;  Location: [facility];  Service: ENT;  Laterality: Bilateral;   NASAL SINUS SURGERY Bilateral 07/09/2020   Procedure: ENDOSCOPIC SINUS WITH BALLOON DILATION FRONTAL OSTIUM;  Surgeon: Ethyl Lonni BRAVO, MD;  Location: [facility];  Service: ENT;  Laterality: Bilateral;   SINUS ENDO WITH FUSION Bilateral 07/09/2020   Procedure: SINUS ENDOSCOPY WITH FUSION NAVIGATION;  Surgeon: Ethyl Lonni BRAVO, MD;  Location: [facility];  Service: ENT;  Laterality: Bilateral;  Allergies  Allergies[1]  Home Medications    Prior to Admission medications  Medication Sig Start Date End Date Taking? Authorizing Provider  busPIRone  (BUSPAR ) 5 MG tablet Take 1 tablet (5 mg total) by mouth 2 (two) times daily. Patient not taking: Reported on  10/23/2024 09/05/24   Dottie Norleen PHEBE PONCE, MD  clonazePAM  (KLONOPIN ) 0.5 MG tablet Take 1 tablet (0.5 mg total) by mouth 2 (two) times daily as needed for anxiety. Patient not taking: Reported on 10/23/2024 06/08/24   Norleen Lynwood ORN, MD  omeprazole  (PRILOSEC) 20 MG capsule Take 1 capsule (20 mg total) by mouth daily. 10/23/24   Beather Delon Gibson, PA  pravastatin  (PRAVACHOL ) 20 MG tablet Take 1 tablet (20 mg total) by mouth daily. Patient taking differently: Take 20 mg by mouth daily. Pt taking every other day 10/06/24   Dottie Norleen PHEBE PONCE, MD    Physical Exam    Vital Signs:  [name] does not have vital signs available for review today.  Given telephonic nature of communication, physical exam is limited. AAOx3. NAD. Normal affect.  Speech and respirations are unlabored.  Accessory Clinical Findings    None  Assessment & Plan    1.  Preoperative Cardiovascular Risk Assessment: According to the Revised Cardiac Risk Index (RCRI), his Perioperative Risk of Major Cardiac Event is (%): 0.4 His Functional Capacity in METs is: 7.59 according to the Duke Activity Status Index (DASI). The patient was advised that if he develops new symptoms prior to surgery to contact our office to arrange for a follow-up visit, and he verbalized understanding. Per AHA/ACC guidelines, he is deemed acceptable risk for the planned procedure without additional cardiovascular testing. Will route to surgical team so they are aware.   He is not currently on antiplatelet or anticoagulation therapy.  A copy of this note will be routed to requesting surgeon.  Time:   Today, I have spent 10 minutes with the patient with telehealth technology discussing medical history, symptoms, and management plan.     Saddie GORMAN Cleaves, NP 10/25/2024, 9:27 AM     [1]  Allergies Allergen Reactions   Atorvastatin      Intolerant   Lexapro  [Escitalopram ]     Intolerant   "

## 2024-10-25 ENCOUNTER — Ambulatory Visit: Attending: Cardiology

## 2024-10-25 DIAGNOSIS — Z01810 Encounter for preprocedural cardiovascular examination: Secondary | ICD-10-CM

## 2024-10-31 ENCOUNTER — Encounter: Payer: Self-pay | Admitting: Gastroenterology

## 2024-11-07 ENCOUNTER — Encounter: Admitting: Gastroenterology

## 2024-11-09 ENCOUNTER — Ambulatory Visit (HOSPITAL_BASED_OUTPATIENT_CLINIC_OR_DEPARTMENT_OTHER)
Admission: EM | Admit: 2024-11-09 | Discharge: 2024-11-09 | Disposition: A | Discharge status: Home / Self Care | Attending: Family Medicine | Admitting: Family Medicine

## 2024-11-09 ENCOUNTER — Encounter (HOSPITAL_BASED_OUTPATIENT_CLINIC_OR_DEPARTMENT_OTHER): Payer: Self-pay | Admitting: Emergency Medicine

## 2024-11-09 ENCOUNTER — Other Ambulatory Visit (HOSPITAL_BASED_OUTPATIENT_CLINIC_OR_DEPARTMENT_OTHER): Payer: Self-pay

## 2024-11-09 ENCOUNTER — Telehealth (HOSPITAL_BASED_OUTPATIENT_CLINIC_OR_DEPARTMENT_OTHER): Payer: Self-pay | Admitting: *Deleted

## 2024-11-09 DIAGNOSIS — R051 Acute cough: Secondary | ICD-10-CM | POA: Diagnosis not present

## 2024-11-09 DIAGNOSIS — R52 Pain, unspecified: Secondary | ICD-10-CM | POA: Diagnosis not present

## 2024-11-09 DIAGNOSIS — J111 Influenza due to unidentified influenza virus with other respiratory manifestations: Secondary | ICD-10-CM

## 2024-11-09 DIAGNOSIS — R509 Fever, unspecified: Secondary | ICD-10-CM

## 2024-11-09 MED ORDER — OSELTAMIVIR PHOSPHATE 75 MG PO CAPS
75.0000 mg | ORAL_CAPSULE | Freq: Two times a day (BID) | ORAL | 0 refills | Status: AC
  Filled 2024-11-09: qty 10, 5d supply, fill #0

## 2024-11-09 MED ORDER — PREDNISONE 20 MG PO TABS
20.0000 mg | ORAL_TABLET | Freq: Every day | ORAL | 0 refills | Status: AC
  Filled 2024-11-09: qty 5, 5d supply, fill #0

## 2024-11-09 NOTE — ED Triage Notes (Signed)
 Pt reports sinus congestion, body aches, fatigue, 99 temp, coughing, runny nose started Tuesday evening.

## 2024-11-09 NOTE — ED Provider Notes (Signed)
 " [facility]    CSN: [id number] Arrival date & time: 11/09/24  1221      History   Chief Complaint Chief Complaint  Patient presents with   Nasal Congestion    HPI Donald Swanson is a 44 y.o. male.   44 year old male with report of bodyaches, fatigue, fever, cough, sinus pressure that started on the evening of 11/07/2024.     Past Medical History:  Diagnosis Date   Anxiety    Aortic root dilatation    Bicuspid aortic valve    f/by Dr. Lavona   Former smoker    Distant history   History of hypogonadism    Hyperlipemia    OSA (obstructive sleep apnea)    with rx declined.   Overweight     Patient Active Problem List   Diagnosis Date Noted   Insomnia 10/03/2024   Viral upper respiratory tract infection 10/03/2024   Urticaria 09/05/2024   Chest pain 03/15/2024   Left shoulder pain 03/15/2024   Pulmonary nodules/lesions, multiple 09/24/2022   Acquired dilation of ascending aorta and aortic root 07/22/2022   Mild aortic insufficiency 06/16/2022   Bicuspid aortic valve 06/16/2022   Heart murmur 05/25/2022   Vitamin D  deficiency 02/18/2021   Bradycardia 02/18/2021   Anxiety 10/18/2018   Rash 04/11/2018   Allergic rhinitis 01/27/2018   Fatigue 10/13/2017   Sleep disturbance 10/13/2017   Snoring 10/13/2017   Deviated nasal septum 10/13/2017   Chronic tonsillitis 10/13/2017   Polycythemia 10/13/2017   Encounter for well adult exam with abnormal findings 05/14/2016   Left carpal tunnel syndrome 03/10/2016   Low testosterone  12/17/2015   Hyperlipidemia 12/17/2015   Sinusitis, acute 12/17/2015    Past Surgical History:  Procedure Laterality Date   ETHMOIDECTOMY Bilateral 07/09/2020   Procedure: TOTAL ETHMOIDECTOMY;  Surgeon: Ethyl Lonni BRAVO, MD;  Location: [facility];  Service: ENT;  Laterality: Bilateral;   KNEE ARTHROSCOPY WITH MEDIAL MENISECTOMY Right 06/10/2015   Procedure: knee arthroscopy with medial menisectomy  ;   Surgeon: Dempsey Sensor, MD;  Location: [facility];  Service: Orthopedics;  Laterality: Right;   MAXILLARY ANTROSTOMY Bilateral 07/09/2020   Procedure: ENDOSCOPIC MAXILLARY ANTROSTOMY;  Surgeon: Ethyl Lonni BRAVO, MD;  Location: [facility];  Service: ENT;  Laterality: Bilateral;   NASAL SEPTOPLASTY W/ TURBINOPLASTY Bilateral 07/09/2020   Procedure: NASAL SEPTOPLASTY WITH TURBINATE REDUCTION;  Surgeon: Ethyl Lonni BRAVO, MD;  Location: [facility];  Service: ENT;  Laterality: Bilateral;   NASAL SINUS SURGERY Bilateral 07/09/2020   Procedure: ENDOSCOPIC SINUS WITH BALLOON DILATION FRONTAL OSTIUM;  Surgeon: Ethyl Lonni BRAVO, MD;  Location: [facility];  Service: ENT;  Laterality: Bilateral;   SINUS ENDO WITH FUSION Bilateral 07/09/2020   Procedure: SINUS ENDOSCOPY WITH FUSION NAVIGATION;  Surgeon: Ethyl Lonni BRAVO, MD;  Location: [facility];  Service: ENT;  Laterality: Bilateral;       Home Medications    Prior to Admission medications  Medication Sig Start Date End Date Taking? Authorizing Provider  busPIRone  (BUSPAR ) 5 MG tablet Take 1 tablet (5 mg total) by mouth 2 (two) times daily. 09/05/24  Yes Dottie Norleen PHEBE PONCE, MD  omeprazole  (PRILOSEC) 20 MG capsule Take 1 capsule (20 mg total) by mouth daily. 10/23/24  Yes Beather Delon Gibson, PA  oseltamivir  (TAMIFLU ) 75 MG capsule Take 1 capsule (75 mg total) by mouth every 12 (twelve) hours. 11/09/24  Yes Ival Domino, FNP  pravastatin  (PRAVACHOL ) 20 MG  tablet Take 1 tablet (20 mg total) by mouth daily. Patient taking differently: Take 20 mg by mouth daily. Pt taking every other day 10/06/24  Yes Dottie Norleen PHEBE PONCE, MD  predniSONE  (DELTASONE ) 20 MG tablet Take 1 tablet (20 mg total) by mouth daily with breakfast for 5 days. 11/09/24 11/14/24 Yes Ival Domino, FNP    Family History Family History  Problem Relation Age of Onset   COPD Mother    Atrial  fibrillation Mother    Osteoarthritis Father        Largely unknown    Social History Social History[1]   Allergies   Atorvastatin  and Lexapro  [escitalopram ]   Review of Systems Review of Systems  Constitutional:  Positive for fatigue and fever. Negative for chills.  HENT:  Positive for congestion, postnasal drip and rhinorrhea. Negative for ear pain and sore throat.   Eyes:  Negative for pain and visual disturbance.  Respiratory:  Positive for cough.   Cardiovascular:  Negative for chest pain and palpitations.  Gastrointestinal:  Negative for abdominal pain, constipation, diarrhea, nausea and vomiting.  Genitourinary:  Negative for dysuria and hematuria.  Musculoskeletal:  Positive for arthralgias. Negative for back pain.  Skin:  Negative for color change and rash.  Neurological:  Negative for seizures and syncope.  All other systems reviewed and are negative.    Physical Exam Triage Vital Signs ED Triage Vitals  Encounter Vitals Group     BP 11/09/24 1228 134/79     Girls Systolic BP Percentile --      Girls Diastolic BP Percentile --      Boys Systolic BP Percentile --      Boys Diastolic BP Percentile --      Pulse Rate 11/09/24 1228 (!) 55     Resp 11/09/24 1228 18     Temp 11/09/24 1228 98.2 F (36.8 C)     Temp Source 11/09/24 1228 Oral     SpO2 11/09/24 1228 98 %     Weight --      Height --      Head Circumference --      Peak Flow --      Pain Score 11/09/24 1227 0     Pain Loc --      Pain Education --      Exclude from Growth Chart --    No data found.  Updated Vital Signs BP 134/79 (BP Location: Left Arm)   Pulse (!) 55   Temp 98.2 F (36.8 C) (Oral)   Resp 18   SpO2 98%   Visual Acuity Right Eye Distance:   Left Eye Distance:   Bilateral Distance:    Right Eye Near:   Left Eye Near:    Bilateral Near:     Physical Exam Vitals and nursing note reviewed.  Constitutional:      General: He is not in acute distress.    Appearance:  He is well-developed. He is not ill-appearing, toxic-appearing or diaphoretic.  HENT:     Head: Normocephalic and atraumatic.     Right Ear: Hearing, tympanic membrane, ear canal and external ear normal.     Left Ear: Hearing, tympanic membrane, ear canal and external ear normal.     Nose: Congestion and rhinorrhea present. Rhinorrhea is clear.     Right Sinus: No maxillary sinus tenderness or frontal sinus tenderness.     Left Sinus: No maxillary sinus tenderness or frontal sinus tenderness.     Mouth/Throat:  Lips: Pink.     Mouth: Mucous membranes are moist.     Pharynx: Uvula midline. No oropharyngeal exudate or posterior oropharyngeal erythema.     Tonsils: No tonsillar exudate.  Eyes:     Conjunctiva/sclera: Conjunctivae normal.     Pupils: Pupils are equal, round, and reactive to light.  Cardiovascular:     Rate and Rhythm: Normal rate and regular rhythm.     Heart sounds: S1 normal and S2 normal. No murmur heard. Pulmonary:     Effort: Pulmonary effort is normal. No respiratory distress.     Breath sounds: Normal breath sounds. No decreased breath sounds, wheezing, rhonchi or rales.  Abdominal:     General: Bowel sounds are normal.     Palpations: Abdomen is soft.     Tenderness: There is no abdominal tenderness.  Musculoskeletal:        General: No swelling.     Cervical back: Neck supple.  Lymphadenopathy:     Head:     Right side of head: No submental, submandibular, tonsillar, preauricular or posterior auricular adenopathy.     Left side of head: No submental, submandibular, tonsillar, preauricular or posterior auricular adenopathy.     Cervical: No cervical adenopathy.     Right cervical: No superficial cervical adenopathy.    Left cervical: No superficial cervical adenopathy.  Skin:    General: Skin is warm and dry.     Capillary Refill: Capillary refill takes less than 2 seconds.     Findings: No rash.  Neurological:     Mental Status: He is alert and  oriented to person, place, and time.  Psychiatric:        Mood and Affect: Mood normal.      UC Treatments / Results  Labs (all labs ordered are listed, but only abnormal results are displayed) Labs Reviewed - No data to display  EKG   Radiology No results found.  Procedures Procedures (including critical care time)  Medications Ordered in UC Medications - No data to display  Initial Impression / Assessment and Plan / UC Course  I have reviewed the triage vital signs and the nursing notes.  Pertinent labs & imaging results that were available during my care of the patient were reviewed by me and considered in my medical decision making (see chart for details).  Plan of Care (see discharge instructions for additional patient precautions and education): Probable influenza with bodyaches, low-grade fever and cough: Tamiflu  75 mg twice daily for 5 days.  Prednisone  20 mg daily for 5 days.  Prednisone  was provided for the sinus pressure.  Get plenty of fluids and rest.  May use OTC cough syrup as needed.  Work excuse provided.  Follow-up if symptoms do not improve, worsen or new symptoms occur.  I reviewed the plan of care with the patient and/or the patient's guardian.  The patient and/or guardian had time to ask questions and acknowledged that the questions were answered.  Final Clinical Impressions(s) / UC Diagnoses   Final diagnoses:  Acute cough  Fever, unspecified  Generalized body aches  Influenza     Discharge Instructions      Probable influenza with bodyaches, low-grade fever and cough: Tamiflu  75 mg twice daily for 5 days.  Prednisone  20 mg daily for 5 days.  Get plenty of fluids and rest.  May use OTC cough syrup as needed.  Work excuse provided.  Follow-up if symptoms do not improve, worsen or new symptoms occur.  ED Prescriptions     Medication Sig Dispense Auth. Provider   predniSONE  (DELTASONE ) 20 MG tablet Take 1 tablet (20 mg total) by mouth  daily with breakfast for 5 days. 5 tablet Kendrick Haapala, FNP   oseltamivir  (TAMIFLU ) 75 MG capsule Take 1 capsule (75 mg total) by mouth every 12 (twelve) hours. 10 capsule Ival Domino, FNP      PDMP not reviewed this encounter.    [1]  Social History Tobacco Use   Smoking status: Former    Current packs/day: 0.25    Average packs/day: 0.3 packs/day for 9.0 years (2.3 ttl pk-yrs)    Types: Cigarettes   Smokeless tobacco: Never  Vaping Use   Vaping status: Never Used  Substance Use Topics   Alcohol use: Yes    Comment: Light   Drug use: Yes    Types: Marijuana    Comment: Occasional gummy     Ival Domino, FNP 11/09/24 1253  "

## 2024-11-09 NOTE — Telephone Encounter (Signed)
 Pt. Made aware.

## 2024-11-09 NOTE — Discharge Instructions (Addendum)
 Probable influenza with bodyaches, low-grade fever and cough: Tamiflu  75 mg twice daily for 5 days.  Prednisone  20 mg daily for 5 days.  Get plenty of fluids and rest.  May use OTC cough syrup as needed.  Work excuse provided.  Follow-up if symptoms do not improve, worsen or new symptoms occur.

## 2024-11-24 ENCOUNTER — Encounter: Admitting: Gastroenterology

## 2025-04-03 ENCOUNTER — Ambulatory Visit (HOSPITAL_BASED_OUTPATIENT_CLINIC_OR_DEPARTMENT_OTHER): Admitting: Family Medicine
